# Patient Record
Sex: Female | Born: 1945
Health system: Southern US, Community
[De-identification: ages and names within clinical notes are randomized; demographics above are authoritative.]

## PROBLEM LIST (undated history)

## (undated) DIAGNOSIS — C50919 Malignant neoplasm of unspecified site of unspecified female breast: Secondary | ICD-10-CM

## (undated) DIAGNOSIS — I10 Essential (primary) hypertension: Secondary | ICD-10-CM

## (undated) DIAGNOSIS — Z87440 Personal history of urinary (tract) infections: Secondary | ICD-10-CM

## (undated) DIAGNOSIS — E1129 Type 2 diabetes mellitus with other diabetic kidney complication: Secondary | ICD-10-CM

## (undated) DIAGNOSIS — D219 Benign neoplasm of connective and other soft tissue, unspecified: Secondary | ICD-10-CM

## (undated) DIAGNOSIS — M8580 Other specified disorders of bone density and structure, unspecified site: Secondary | ICD-10-CM

## (undated) DIAGNOSIS — R809 Proteinuria, unspecified: Secondary | ICD-10-CM

## (undated) DIAGNOSIS — R011 Cardiac murmur, unspecified: Secondary | ICD-10-CM

## (undated) DIAGNOSIS — Z800 Family history of malignant neoplasm of digestive organs: Secondary | ICD-10-CM

## (undated) DIAGNOSIS — Z8041 Family history of malignant neoplasm of ovary: Secondary | ICD-10-CM

## (undated) DIAGNOSIS — D0339 Melanoma in situ of other parts of face: Secondary | ICD-10-CM

## (undated) DIAGNOSIS — Z803 Family history of malignant neoplasm of breast: Secondary | ICD-10-CM

## (undated) DIAGNOSIS — Z806 Family history of leukemia: Secondary | ICD-10-CM

## (undated) DIAGNOSIS — E785 Hyperlipidemia, unspecified: Secondary | ICD-10-CM

## (undated) HISTORY — PX: CATARACT EXTRACTION: SUR2

## (undated) HISTORY — DX: Personal history of urinary (tract) infections: Z87.440

## (undated) HISTORY — DX: Melanoma in situ of other parts of face: D03.39

## (undated) HISTORY — DX: Essential (primary) hypertension: I10

## (undated) HISTORY — PX: ECTOPIC PREGNANCY SURGERY: SHX613

## (undated) HISTORY — DX: Family history of leukemia: Z80.6

## (undated) HISTORY — DX: Proteinuria, unspecified: E11.29

## (undated) HISTORY — DX: Malignant neoplasm of unspecified site of unspecified female breast: C50.919

## (undated) HISTORY — DX: Hyperlipidemia, unspecified: E78.5

## (undated) HISTORY — DX: Cardiac murmur, unspecified: R01.1

## (undated) HISTORY — DX: Benign neoplasm of connective and other soft tissue, unspecified: D21.9

## (undated) HISTORY — DX: Family history of malignant neoplasm of ovary: Z80.41

## (undated) HISTORY — DX: Family history of malignant neoplasm of breast: Z80.3

## (undated) HISTORY — PX: ABDOMINAL HYSTERECTOMY: SHX81

## (undated) HISTORY — DX: Proteinuria, unspecified: R80.9

## (undated) HISTORY — DX: Other specified disorders of bone density and structure, unspecified site: M85.80

## (undated) HISTORY — DX: Family history of malignant neoplasm of digestive organs: Z80.0

## (undated) HISTORY — PX: CHOLECYSTECTOMY: SHX55

---

## 1998-05-22 ENCOUNTER — Observation Stay (HOSPITAL_COMMUNITY): Admission: RE | Admit: 1998-05-22 | Discharge: 1998-05-23 | Payer: Self-pay | Admitting: General Surgery

## 1998-05-30 ENCOUNTER — Ambulatory Visit (HOSPITAL_COMMUNITY): Admission: RE | Admit: 1998-05-30 | Discharge: 1998-05-30 | Payer: Self-pay | Admitting: General Surgery

## 1999-03-25 ENCOUNTER — Other Ambulatory Visit: Admission: RE | Admit: 1999-03-25 | Discharge: 1999-03-25 | Payer: Self-pay | Admitting: Obstetrics and Gynecology

## 2000-03-26 ENCOUNTER — Other Ambulatory Visit: Admission: RE | Admit: 2000-03-26 | Discharge: 2000-03-26 | Payer: Self-pay | Admitting: Obstetrics and Gynecology

## 2001-03-29 ENCOUNTER — Other Ambulatory Visit: Admission: RE | Admit: 2001-03-29 | Discharge: 2001-03-29 | Payer: Self-pay | Admitting: Obstetrics and Gynecology

## 2002-04-06 ENCOUNTER — Other Ambulatory Visit: Admission: RE | Admit: 2002-04-06 | Discharge: 2002-04-06 | Payer: Self-pay | Admitting: Obstetrics and Gynecology

## 2002-06-06 ENCOUNTER — Ambulatory Visit (HOSPITAL_COMMUNITY): Admission: RE | Admit: 2002-06-06 | Discharge: 2002-06-06 | Payer: Self-pay | Admitting: Gastroenterology

## 2003-04-21 ENCOUNTER — Other Ambulatory Visit: Admission: RE | Admit: 2003-04-21 | Discharge: 2003-04-21 | Payer: Self-pay | Admitting: Obstetrics and Gynecology

## 2003-06-01 ENCOUNTER — Encounter (INDEPENDENT_AMBULATORY_CARE_PROVIDER_SITE_OTHER): Payer: Self-pay | Admitting: *Deleted

## 2003-06-01 ENCOUNTER — Observation Stay (HOSPITAL_COMMUNITY): Admission: RE | Admit: 2003-06-01 | Discharge: 2003-06-02 | Payer: Self-pay | Admitting: Obstetrics and Gynecology

## 2007-02-08 ENCOUNTER — Encounter: Admission: RE | Admit: 2007-02-08 | Discharge: 2007-02-08 | Payer: Self-pay | Admitting: Internal Medicine

## 2007-03-26 ENCOUNTER — Other Ambulatory Visit: Admission: RE | Admit: 2007-03-26 | Discharge: 2007-03-26 | Payer: Self-pay | Admitting: Obstetrics and Gynecology

## 2008-03-09 ENCOUNTER — Inpatient Hospital Stay (HOSPITAL_COMMUNITY): Admission: AD | Admit: 2008-03-09 | Discharge: 2008-03-17 | Payer: Self-pay | Admitting: Internal Medicine

## 2008-10-11 ENCOUNTER — Other Ambulatory Visit: Admission: RE | Admit: 2008-10-11 | Discharge: 2008-10-11 | Payer: Self-pay | Admitting: Obstetrics and Gynecology

## 2010-12-18 ENCOUNTER — Encounter
Admission: RE | Admit: 2010-12-18 | Discharge: 2010-12-18 | Payer: Self-pay | Source: Home / Self Care | Attending: Radiology | Admitting: Radiology

## 2010-12-31 ENCOUNTER — Encounter
Admission: RE | Admit: 2010-12-31 | Discharge: 2010-12-31 | Payer: Self-pay | Source: Home / Self Care | Attending: Radiology | Admitting: Radiology

## 2010-12-31 ENCOUNTER — Other Ambulatory Visit: Payer: Self-pay | Admitting: Diagnostic Radiology

## 2011-01-09 ENCOUNTER — Ambulatory Visit: Admission: RE | Admit: 2011-01-09 | Payer: Self-pay | Source: Home / Self Care | Admitting: Surgery

## 2011-01-16 ENCOUNTER — Other Ambulatory Visit (HOSPITAL_COMMUNITY): Payer: Self-pay | Admitting: Surgery

## 2011-01-16 DIAGNOSIS — C50911 Malignant neoplasm of unspecified site of right female breast: Secondary | ICD-10-CM

## 2011-01-22 ENCOUNTER — Encounter: Payer: Self-pay | Admitting: Surgery

## 2011-01-22 DIAGNOSIS — C50919 Malignant neoplasm of unspecified site of unspecified female breast: Secondary | ICD-10-CM

## 2011-01-22 HISTORY — DX: Malignant neoplasm of unspecified site of unspecified female breast: C50.919

## 2011-01-28 ENCOUNTER — Encounter (HOSPITAL_COMMUNITY)
Admission: RE | Admit: 2011-01-28 | Discharge: 2011-01-28 | Disposition: A | Discharge status: Home / Self Care | Payer: Medicare Other | Source: Ambulatory Visit | Attending: Surgery | Admitting: Surgery

## 2011-01-28 ENCOUNTER — Ambulatory Visit (HOSPITAL_COMMUNITY)
Admission: RE | Admit: 2011-01-28 | Discharge: 2011-01-28 | Disposition: A | Discharge status: Home / Self Care | Payer: Medicare Other | Source: Ambulatory Visit | Attending: Surgery | Admitting: Surgery

## 2011-01-28 ENCOUNTER — Other Ambulatory Visit (HOSPITAL_COMMUNITY): Payer: Self-pay | Admitting: Surgery

## 2011-01-28 DIAGNOSIS — C50911 Malignant neoplasm of unspecified site of right female breast: Secondary | ICD-10-CM

## 2011-01-28 DIAGNOSIS — Z01818 Encounter for other preprocedural examination: Secondary | ICD-10-CM | POA: Insufficient documentation

## 2011-01-28 DIAGNOSIS — C50919 Malignant neoplasm of unspecified site of unspecified female breast: Secondary | ICD-10-CM | POA: Insufficient documentation

## 2011-01-28 HISTORY — PX: SIMPLE MASTECTOMY: SHX2409

## 2011-01-28 LAB — CBC
HCT: 45.9 % (ref 36.0–46.0)
MCHC: 34.4 g/dL (ref 30.0–36.0)
MCV: 88.6 fL (ref 78.0–100.0)
RDW: 12.3 % (ref 11.5–15.5)
WBC: 7.4 10*3/uL (ref 4.0–10.5)

## 2011-01-28 LAB — BASIC METABOLIC PANEL
BUN: 7 mg/dL (ref 6–23)
CO2: 29 mEq/L (ref 19–32)
GFR calc non Af Amer: >60 mL/min (ref >60)
Glucose, Bld: 170 mg/dL — ABNORMAL HIGH (ref 70–99)
Potassium: 5 mEq/L (ref 3.5–5.1)

## 2011-01-28 LAB — CANCER ANTIGEN 27.29: CA 27.29: 43 U/mL — ABNORMAL HIGH (ref 0–39)

## 2011-01-30 ENCOUNTER — Observation Stay (HOSPITAL_COMMUNITY)
Admission: RE | Admit: 2011-01-30 | Discharge: 2011-02-01 | Disposition: A | Discharge status: Home / Self Care | Payer: Medicare Other | Attending: Surgery | Admitting: Surgery

## 2011-01-30 ENCOUNTER — Ambulatory Visit (HOSPITAL_COMMUNITY)
Admission: RE | Admit: 2011-01-30 | Discharge: 2011-01-30 | Disposition: A | Discharge status: Home / Self Care | Payer: Medicare Other | Source: Ambulatory Visit | Attending: Surgery | Admitting: Surgery

## 2011-01-30 ENCOUNTER — Other Ambulatory Visit: Payer: Self-pay | Admitting: Surgery

## 2011-01-30 DIAGNOSIS — Z01810 Encounter for preprocedural cardiovascular examination: Secondary | ICD-10-CM | POA: Insufficient documentation

## 2011-01-30 DIAGNOSIS — C50911 Malignant neoplasm of unspecified site of right female breast: Secondary | ICD-10-CM

## 2011-01-30 DIAGNOSIS — C50919 Malignant neoplasm of unspecified site of unspecified female breast: Principal | ICD-10-CM | POA: Insufficient documentation

## 2011-01-30 MED ORDER — TECHNETIUM TC 99M SULFUR COLLOID FILTERED
1.0000 | Freq: Once | INTRAVENOUS | Status: AC | PRN
  Administered 2011-01-30: 1 via INTRADERMAL

## 2011-02-04 NOTE — Discharge Summary (Signed)
NAMESUNYA, Breanna Neal             ACCOUNT NO.:  [id number]  MEDICAL RECORD NO.:  [id number]           PATIENT TYPE:  I  LOCATION:  5156                         FACILITY:  [facility]  PHYSICIAN:  Sandria Bales. Ezzard Standing, M.D.  DATE OF BIRTH:  June 17, 1946  DATE OF ADMISSION:  01/30/2011 DATE OF DISCHARGE:  01/31/2011                              DISCHARGE SUMMARY  DISCHARGE DIAGNOSES: 1. Multifocal right breast carcinoma. 2. Hypercholesterolemia.  OPERATIONS PERFORMED:  On January 30, 2011, the patient had a right simple mastectomy and right axillary sentinel lymph node biopsy by Dr. Ovidio Kin and reconstruction of the right breast with a tissue expander and HD Flex by Dr. Etter Sjogren.  HISTORY OF PRESENT ILLNESS:  [name] is a 65 year old white female, patient of Rodrigo Ran, who had an abnormal mammogram which on biopsy showed a ductal carcinoma in situ in the upper quadrant of her right breast.  This biopsy was on December 10, 2010.  Unfortunately, when she underwent MRI, she was found to have a secondary area of concern and this was biopsied on December 31, 2010, and proved to be a grade 1 invasive ductal carcinoma.  So, she had 2 areas of breast cancer which were widely separated consistent with multifocal disease.  We talked about the possibility of lumpectomy and felt that she would be best served with proceeding with mastectomy to control her breast cancer.  She saw Dr. Etter Sjogren for consideration of breast reconstruction at the same time with mastectomy.  REVIEW OF SYSTEMS:  Significant that she has been treated for hypercholesterolemia, has had regular colonoscopies every 5 years by Dr. Dorena Cookey because her mother had colon cancer, has had some history of kidney infections followed by Dr. Larey Dresser, but otherwise has actually been healthy.  On the day of admission, she was taken to the operating room.  She underwent an injection of her right breast with  technetium, sulfur colloid, and methylene blue and then underwent identification of her right axillary sentinel lymph node with a right simple mastectomy by Dr. Ovidio Kin and then Dr. Etter Sjogren did immediate reconstruction of her right breast with a tissue expander HD Flex mesh.  The patient is now 1 day postop.  She has done well.  Her temperature is 98.3, her pulse is 69.  Her JP drains have only drained about 50 mL out of 2 drains and she appears ready for discharge.  My portion of her discharge instructions will include:   Activity per Dr. Odis Luster.   She have a regular diet.   Wound care per Dr. Odis Luster.   She is to see me back in 2 weeks for followup wound check and review of pathology. She is to check with Dr. Odis Luster early next week to follow up on her drains and breast wound.   I have given her Vicodin 5/325, #30 tablets with 1 refill for pain, and then she will resume home medications on discharge which include: 1. Lipitor 40 mg daily. 2. She takes vitamins.   Sandria Bales. Ezzard Standing, M.D., FACS   DHN/MEDQ  D:  01/31/2011  T:  01/31/2011  Job:  [id number]  cc:   [name], M.D. Etter Sjogren, M.D. Dr. Jeralyn Ruths Edwena Felty. Romine, M.D.  Electronically Signed by Ovidio Kin M.D. on 02/04/2011 09:12:59 AM

## 2011-02-04 NOTE — Op Note (Signed)
Breanna Neal, Breanna Neal             ACCOUNT NO.:  [id number]  MEDICAL RECORD NO.:  [id number]           PATIENT TYPE:  I  LOCATION:  5156                         FACILITY:  [facility]  PHYSICIAN:  Breanna Neal, M.D.  DATE OF BIRTH:  1946/08/17  DATE OF PROCEDURE:  01/28/2011                              OPERATIVE REPORT   PREOPERATIVE DIAGNOSIS:  Right breast cancer, multifocal.  POSTOPERATIVE DIAGNOSIS:  Right breast cancer, multifocal.  PROCEDURE: 1. Right breast simple mastectomy. 2. Injection of methylene blue. 3. Right axillary sentinel lymph node biopsy by Breanna Neal. 4. Breast reconstruction by Breanna Neal.  ANESTHESIA:  General endotracheal.  ESTIMATED BLOOD LOSS:  150 mL.  DRAINS:  Left in were none.  INDICATION FOR PROCEDURE:  Breanna Neal is a 65 year old white female, patient of Breanna Neal, who had an abnormal mammogram, which on biopsy showed ductal carcinoma in situ in the upper quadrant of herright breast.  Unfortunately, she had a second area seen on MRI, which was biopsied on December 31, 2010, this was proved to be a grade 1 invasive ductal carcinoma.  This was more central in the breast.  These two areas are so widely separated, we talked about a wide lumpectomy versus mastectomy, but felt in the patient's best interest, should be best served with a mastectomy.  She has seen Breanna Neal for discussion of breast reconstruction.  The indications and potential complications of surgery had been explained to the patient.  Potential complications include, but are not limited to, bleeding, infection, nerve injury, and recurrence of the cancer.  OPERATIVE NOTE:  The patient was placed in the supine position.  She had injected in the holding area of 1 millicurie of Technetium sulfur colloid for identification of the sentinel lymph node biopsy.  She was taken to the operating room in room #4 where she underwent a general anesthesia, supervised  by Breanna Neal.  Both breasts and right axilla were prepped with ChloraPrep and sterilely draped.  A time-out was held and surgical checklist brought out.  I also injected her right breast with 40% methylene blue using approximately 1 mL.  I first dissected the axillary lymph node.  I cut down two lymph nodes which was hot and I identified a lymph node that had counts of 1200 with a background of 5, and this was sent for permanent right axillary sentinel lymph node biopsy.  Hemostasis with Bovie electrocautery and 3- 0 Vicryl pop-off.    I then started the mastectomy.  I tried to do a spare skin as best as possible.  I did excise the nipple.  I went medially to the lateral edge of the sternum, inferiorly to the investing fascia of the rectus abdominis muscle, superiorly to about two fingerbreadths below the clavicle, and laterally to latissimus dorsi muscle.  I then reflected the breast off the pectoralis major muscle and dissected to where I done the sentinel lymph node biopsy, taking the breast tissue all the way to the biopsy cavity in the right axilla.  I then put a long suture to mark the lateral margin of the breast.  I then irrigated the chest with saline about 2 liters.  I controlled the bleeding with Bovie electrocautery primarily.  I did use some 3-0 Vicryl pop-offs for a couple of bleeders. At this point, Breanna Neal scrubbed in for the planned breast reconstruction.  He will dictate this part of the operation.  The patient tolerated the procedure well.  She remains in the operating room with Breanna Neal completing the operation.   Breanna Neal, M.D., FACS   DHN/MEDQ  D:  01/30/2011  T:  01/31/2011  Job:  [id number]  cc:   Breanna Neal, M.D. Breanna Neal, M.D. Breanna Neal  Electronically Signed by Breanna Neal M.D. on 02/04/2011 09:10:20 AM

## 2011-02-26 ENCOUNTER — Encounter (HOSPITAL_BASED_OUTPATIENT_CLINIC_OR_DEPARTMENT_OTHER): Payer: Medicare Other | Admitting: Oncology

## 2011-02-26 DIAGNOSIS — D0590 Unspecified type of carcinoma in situ of unspecified breast: Secondary | ICD-10-CM

## 2011-03-13 NOTE — Discharge Summary (Signed)
  NAMEJERMEKA, Breanna Neal             ACCOUNT NO.:  [id number]  MEDICAL RECORD NO.:  [id number]           PATIENT TYPE:  I  LOCATION:  5156                         FACILITY:  [facility]  PHYSICIAN:  Etter Sjogren, M.D.     DATE OF BIRTH:  08/24/1946  DATE OF ADMISSION:  01/30/2011 DATE OF DISCHARGE:  02/01/2011                              DISCHARGE SUMMARY   FINAL DIAGNOSIS:  Right breast cancer.  PROCEDURES: 1. Right mastectomy. 2. Right sentinel lymph node. 3. Right breast reconstruction with tissue expander and acellular     dermal matrix.  HISTORY AND PHYSICAL:  A 65 year old woman who has right breast cancer. Mastectomy was planned and she voiced understanding reconstruction. Options discussed.  She selected tissue expander as a planned stage procedure with eventual placement of an implant.  She understood that we might need to use the acellular dermal matrix with this reconstruction. For further details of history and physical, please see the chart.  COURSE IN THE HOSPITAL:  On admission, she was taken to surgery, at which time the procedures were performed.  She tolerated those very well.  Postoperatively, she did well.  Skin flaps, superior mastectomy flap, little bit dusky, and the inferior mastectomy flaps looked very good.  By the second postoperative day that area had demarcated and it was just a very narrow band just a few millimeters wide at medial aspect of the superior flap indicative of epidermolysis.  The remainder of it has good capillary refill, 1-2 seconds, and looks much better today. Drains were functioning.  Drainage is thin.  She did have a little bit of nausea early this morning of discharge when she took pain medicine on an empty stomach that has resolved and she feels that she did not have any nausea yesterday on the same pain medicine and she would like to go ahead with her discharge today.  DISPOSITION:  Empty the drains three times a day, record the  amount. Incentive spirometry every day.  No lifting.  No vigorous activity.  No exercising.  No shower yet.  Follow up with me in the office next week.  DISCHARGE MEDICATIONS: 1. Dilaudid 2-4 mg p.o. q.4 h. p.r.n. for pain. 2. Phenergan 25 mg one p.o. q.4 p.r.n. nausea. 3. Robaxin 500 mg one p.o. q.8. 4. Keflex 500 mg one p.o. b.i.d. and then for 3 days and then one p.o.     daily. 5. Colace 100 mg p.o. b.i.d. 6. Stool softener.     Etter Sjogren, M.D.     DB/MEDQ  D:  02/01/2011  T:  02/01/2011  Job:  [id number]  Electronically Signed by Etter Sjogren M.D. on 03/13/2011 05:05:44 PM

## 2011-03-13 NOTE — Op Note (Signed)
Breanna Neal, Breanna Neal             ACCOUNT NO.:  [id number]  MEDICAL RECORD NO.:  [id number]           PATIENT TYPE:  I  LOCATION:  5156                         FACILITY:  [facility]  PHYSICIAN:  Etter Sjogren, M.D.     DATE OF BIRTH:  07-Mar-1946  DATE OF PROCEDURE:  01/30/2011 DATE OF DISCHARGE:                              OPERATIVE REPORT   PREOPERATIVE DIAGNOSIS:  Right breast cancer.  POSTOPERATIVE DIAGNOSIS:  Right breast cancer.  PROCEDURE:  Reconstruction right breast tissue with expander and HD Flex (acellular dermal matrix).  SURGEON:  Etter Sjogren, MD  ASSISTANT:  None.  ANESTHESIA:  General.  ESTIMATED BLOOD LOSS:  40 mL.  DRAINS:  Two 19-French.  CLINICAL NOTE:  This 65 year old woman has right breast cancer. Mastectomy is planned and she desires reconstruction, options discussed. She selected a staged procedure, placement of tissue expander and possible using HD Flex today and then eventually as a staged procedure removing the expander and placing implant.  Ashby Dawes of procedure, risks, plus complications discussed her in great detail and she understood these risks and wished to proceed.  PROCEDURE:  Dr. Ezzard Standing, General Surgery, had completed the mastectomy. The dissection was carried deep to the pectoralis major muscle and space was created there.  There was an area of significant weakness medially. Thorough irrigation with saline and the tissue expander was prepared after thoroughly cleaning gloves.  This was a Dance movement psychotherapist low-contour 650 mL expander in antibiotic solution.  All of the air was removed, 150 mL sterile saline placed using a closed filling system.  Antibiotic solution placed in the wound as well.  Excellent hemostasis was confirmed and the expander in position.  The HD Flex was then used ultra thick with the care taken to place the dermal side up towards the mastectomy flap skin and it was sutured om placed using a PDS 3-0 running simple suture to the  pectoralis muscle both medial and lateral and then down onto the chest wall laterally.  Great care was taken to avoid damage to the underlying tissue expander which was kept under direct vision at all times.  After conclusion of this closure, the tissue expander was well covered and again this was a 550 mL low-contour [place] tissue expander.  Two drains were positioned 19-French, brought through separate stab wounds inferolaterally and secured with 3-0 Prolene suture.  Irrigation with saline, irrigation with antibiotic solution, and excellent hemostasis was confirmed.  The superior mastectomy flap appeared to be dusky.  The superior skin was trimmed. The inferior flaps looked very healthy.  Bright red bleeding was then noted on the superior flap which was sent back to that point and the skin appeared much healthier.  It was closed with 3-0 Monocryl interrupted inverted deep dermal sutures and the sentinel lymph node biopsy site also 3-0 Monocryl interrupted inverted deep dermal and 4-0 Monocryl running subcuticular suture.  Dermabond was then placed and dry sterile dressings, antibiotic ointment, and dry sterile dressing around the drain sites and the breast vest was positioned, and she was transported to the recovery room stable and tolerated procedure well.     Etter Sjogren, M.D.  DB/MEDQ  D:  01/30/2011  T:  01/31/2011  Job:  [id number]  Electronically Signed by Etter Sjogren M.D. on 03/13/2011 05:05:35 PM

## 2011-05-06 NOTE — H&P (Signed)
Breanna Neal, Breanna Neal             ACCOUNT NO.:  [id number]   MEDICAL RECORD NO.:  [id number]           PATIENT TYPE:   LOCATION:                                 FACILITY:   PHYSICIAN:  Mark A. Perini, M.D.   DATE OF BIRTH:  June 12, 1946   DATE OF ADMISSION:  03/09/2008  DATE OF DISCHARGE:                              HISTORY & PHYSICAL   CHIEF COMPLAINT:  Nausea, vomiting.   HISTORY OF PRESENT ILLNESS:  Breanna Neal is a pleasant 65 year old female  with a benign past history who has been sick for the last 4 or 5 days.  This started 4 days prior to admission with chills and fever to 102  degrees.  She had significant nausea, vomiting and diarrhea which has  persisted through today.  She is passing some urine but is keeping no  foods down and very little bit of fluids down.  She also has developed  some cough and stuffy nose symptoms as well.  She presented to the  office and is tachycardiac with heart rate of 122 and had a temperature  100 and appears dehydrated and will be admitted for further care.   PAST HISTORY:  1. Cholecystectomy, 1999  2. G2, P1, SAB 1 parity status.  She did have an ectopic pregnancy in      the distant past.  Postmenopausal since her late 59s.  History of      fibroid tumors.  3. Seasonal allergic rhinitis.  4. Hyperlipidemia.  5. Vaginal hysterectomy and bilateral oophorectomy.  6. Osteopenia.  7. Overactive bladder.  8. Impaired fasting glucose.  9. Possible white coat syndrome.   ALLERGIES:  NO KNOWN ALLERGIES.   MEDICATIONS:  Calcium with D twice a day, Imodium as needed, aspirin 81  mg twice a week, Allegra as needed, Vytorin 10/40 daily, VESIcare 10 mg  daily, and fish oil 1200 mg daily.   SOCIAL HISTORY:  Her husband, Rosanne Ashing, is with her.  She had been married  since 1964.  She has 1 son and 3 grandchildren.  She has a 12th grade  education.  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  She has no tobacco use.  Rare alcohol  use.  No drug use.   FAMILY HISTORY:  Father died at 87 of pancreatic cancer.  Mother died at  age 27 of colon cancer and lung cancer.  She is 2 sisters living who are  and 1 has hyperlipidemia.   REVIEW OF SYSTEMS:  As per the history present illness.  Again, she has  had shaking chills.  She also reports myalgias.  She has felt somewhat  short of breath as well.   PHYSICAL EXAM:  She appears weak and her skin is dry.  Her or mucous  membranes are dry.  She does respond appropriately and is alert and oriented and  neurologically grossly intact.  There is no icterus or pallor.  Lungs are clear to auscultation bilaterally.  Heart is tachycardiac with no murmur or gallop.  Abdomen reveals normoactive bowel sounds.  There is very slight  tenderness in  the left upper quadrant but no mass.  No rebound, no  guarding.  There is no edema.   LABORATORY DATA:  Pending.   ASSESSMENT AND PLAN:  A 65 year old female with 4 days of nausea,  vomiting and diarrhea with inability to keep down oral food and liquids.  We will admit her and give her IV fluids with normal saline.  We will  check labs including electrolytes and CK enzyme.  We will hydrate her  aggressively and treat her supportively.  I think this is mostly a viral  gastroenteritis at this point.  We will have to see if her lab work  takes [place] in any different direction.      Mark A. Perini, M.D.  Electronically Signed     MAP/MEDQ  D:  03/09/2008  T:  03/09/2008  Job:  [id number]

## 2011-05-06 NOTE — Discharge Summary (Signed)
[name], [name]             ACCOUNT NO.:  [id number]   MEDICAL RECORD NO.:  [id number]          PATIENT TYPE:  INP   LOCATION:  5506                         FACILITY:  [facility]   PHYSICIAN:  Mark A. Perini, M.D.   DATE OF BIRTH:  April 11, 1946   DATE OF ADMISSION:  03/09/2008  DATE OF DISCHARGE:  03/17/2008                               DISCHARGE SUMMARY   DISCHARGE DIAGNOSES:  1. Escherichia coli bacteremia due to pyelonephritis on the left.  2. Gastroenteritis, which probably initiated this entire episode,      resolving at the time of discharge.  3. Volume depletion.  4. Hypokalemia.  5. Severe hypophosphatemia, improved at the time of discharge.  6. Hyperglycemia in the setting of impaired fasting glucose and acute      illness.  7. Hyperlipidemia.  8. Osteopenia.  9. History of overactive bladder.  10.Urosepsis.  11.Slight bright red bleeding per rectum, not thought to have a      significant gastrointestinal bleed this admission.   PROCEDURES:  1. Gastroenterology consultation, ultrasound of the kidneys on March 13, 2008, which showed no evidence for focal intraparenchymal fluid      collection within the left kidney.  2. CT scan of the abdomen and pelvis with IV contrast on March 11, 2008, showed left-sided pyelonephritis.  No evidence of renal or      ureteral calculi or hydronephrosis.  Negative CT of the pelvis.   DISCHARGE MEDICATIONS:  1. She may resume calcium with D twice a day with food.  She may      resume fish oil and vitamin D when she feels is able.  She should      wait a week or so and then resume her Vytorin 10/40 one each      evening.  She may wait to resume her VesiCare until Dr. Logan Bores      instructs; however, if she feels she needs to resume it earlier,      she may do so.  2. Allegra as needed is permissible.  3. Resume 81 mg aspirin twice a week.  4. We are going to treat her with Levaquin 500 mg daily for 7 further      days instead  of Augmentin.  5. Oral disintegrating Zofran as needed for nausea.  6. Tylenol 650 mg every 6 hours as needed.   HISTORY OF PRESENT ILLNESS:  Breanna Neal is a pleasant and otherwise  healthy 65 year old female who presented with 4-5 days of illness.  She  had fevers to 102 degrees and significant nausea, vomiting, and  diarrhea.  She was keeping no food down and very little fluid down.  She  was seen in the office and was found to be tachycardic with a heart rate  of 122 and a temperature of 100 and appeared volume depleted.  She was  admitted for further care.   HOSPITAL COURSE:  Kristan was admitted to a telemetry bed.  She remains  stable from a cardiovascular and pulmonary standpoint.  She was found to  have  positive blood and urine cultures for E. Coli, which was fairly  pansensitive.  She was treated empirically with IV Flagyl and Cipro, and  subsequently transitioned to intravenous Rocephin only.  She had  significant electrolyte disturbances and required replacement of  phosphorus and potassium, but tolerated this well.  She had significant  diarrhea but had negative C. diff toxin studies, and GI followed the  patient and her diarrhea symptoms gradually improved with the care of  her pyelonephritis which was seen on her CT scan of her abdomen and  correlated with her positive blood and urine cultures for E. coli.  Jeanett gradually improved.  Her elevated white count gradually  improved.  Followup renal ultrasound did not show any sign of an abscess  or other complicating feature of the kidney, and by March 17, 2008, she  was deemed stable for discharge home for continued care.   DISCHARGE PHYSICAL EXAM:  VITAL SIGNS:  Temperature 98.2 and afebrile,  pulse 70, respiratory rate 24, blood pressure 139/86, and blood sugars  range from 115 to 133.  GENERAL:  She was in no acute distress.  Alert and oriented x4.  LUNGS:  Clear to auscultation bilaterally with no wheezes, rales, or   rhonchi.  HEART:  Regular rate and rhythm with no murmur, rub, or gallop.  ABDOMEN:  Soft and nontender with no mass or hepatosplenomegaly.  EXTREMITIES:  There was no edema.  NEUROLOGIC:  Grossly intact.   DISCHARGE LABORATORY DATA:  White count of 11.3 with 67% segs, 25%  lymphocytes, 7% monocytes, hemoglobin 14.0, and platelet count 501,000.  Sodium 140, potassium 3.8, chloride 103, CO2 of 26, BUN 9, creatinine  1.03, glucose 108, alk phos 151, AST 29, ALT 30, total protein 6.6,  albumin slightly low at 2.9, calcium 9.4, phosphorus 3.9, and magnesium  2.4.  Other notable laboratory data upon discharge on March 10, 2008,  blood culture showed no growth in 5 days.  Fecal lactoferrin test on  March 12, 2008, was negative.  Stool culture on March 19, 2008 , was  negative.  C. diff toxin on March 12, 2008, was negative.  Blood culture  from March 12, 2008, was negative x2.  Her urine culture from March 10, 2008, grew E. coli greater than 100,000 colonies per mL, which again was  resistant to ampicillin but otherwise pansensitive and so did her blood  culture from March 10, 2008.  C. diff toxin on March 10, 2008, was  negative.  Hemoglobin A1c was 6.3.   DISCHARGE INSTRUCTIONS:  She is to follow a low-salt diet.  She is to  eat bland foods until her stomach tolerates more regular diet.  She is  to call up if she has any problem.  She will follow up in 2-3 weeks with  Dr. Waynard Edwards, and follow up with urology, Dr. Logan Bores, in 2-3 weeks as well.  She is to check her blood sugars twice a day before breakfast and  supper, and call us if they are persisting over 180.      Mark A. Perini, M.D.  Electronically Signed     MAP/MEDQ  D:  03/17/2008  T:  03/18/2008  Job:  [id number]   cc:   Jamison Neighbor, M.D.

## 2011-05-06 NOTE — Consult Note (Signed)
NAMEAMBERLI, RUEGG NO.:  [id number]   MEDICAL RECORD NO.:  [id number]          PATIENT TYPE:  INP   LOCATION:  5507                         FACILITY:  [facility]   PHYSICIAN:  Bernette Redbird, M.D.   DATE OF BIRTH:  08-Apr-1946   DATE OF CONSULTATION:  03/10/2008  DATE OF DISCHARGE:                                 CONSULTATION   REFERRING PHYSICIAN:  Mark A. Perini, M.D.   REASON FOR CONSULTATION:  We were asked to see Ms. Kost today in  consultation for nausea, vomiting and diarrhea x5 days by Dr. Rodrigo Ran.   HISTORY OF PRESENT ILLNESS:  This is a 65 year old female who was  admitted for dehydration by Dr. Waynard Edwards.  The patient reports that she  began to feel bad last Friday night approximately 1 week ago.  Her main  complaint then was nausea.  She started vomiting on Monday night.  Her  emesis consisted of food and clear liquid.  She denied any hematemesis  or coffee-grounds.  The patient normally has two loose bowel movements  per day, but for several days when she began to feel ill, she was  constipated.  Now for the last day and half or so, she has had watery  diarrhea.  She tells me that she had five watery bowel movements so far  today.  She denies any melena or hematochezia.  She denies any recent  antibiotic use or NSAID use.  She also denies abdominal pain.  Currently, the patient is feeling much better and she is able to keep  down liquids.   PAST MEDICAL HISTORY:  1. Osteopenia.  2. Hyperlipidemia.  3. Overactive bladder.  4. Possible white coat syndrome.  5. Allergic rhinitis.  6. Status post cholecystectomy.  7. Status post vaginal hysterectomy.  8. She is a patient of Dr. Dorena Cookey and he has performed two      colonoscopies on her, one in 2003, the other in 2008, both of which      were normal.  The most recent colonoscopy report is on the chart.   ALLERGIES:  No known drug allergies.   CURRENT MEDICATIONS:  Vytorin, Vesicare, aspirin  81 mg twice a week,  calcium, Imodium, Allegra and fish oil.   REVIEW OF SYSTEMS:  Significant for chills, myalgia, shortness of breath  and cough prior to vomiting over the last several days.  Again, these  have improved substantially today.  She has no sore throat.   FAMILY HISTORY:  Significant for colon cancer in her mother who died at  50 years of age and her father with pancreatic cancer who died in his  26s.   SOCIAL HISTORY:  Negative for tobacco or drugs.  She reports rare  alcohol.  She is an Pensions consultant for [employer].   PHYSICAL EXAMINATION:  GENERAL:  She is alert and oriented, very  pleasant to speak with.  She is in no apparent distress.  VITAL SIGNS:  Temperature is 99.1.  That is down from a high of 102  earlier this week, pulse is 92, respirations are 20,  blood pressure is  104/66.  HEART:  Regular rate and rhythm, slightly tachycardic.  LUNGS:  Clear to auscultation bilaterally.  ABDOMEN:  Soft, nontender, nondistended with good bowel sounds.   LABORATORY DATA AND X-RAY FINDINGS:  Significant for a phosphorus less  than 1, potassium 2.9, BUN 15, creatinine 0.96, glucose 200.  Hemoglobin  12.3, white count 11.6, hematocrit 36.0, platelets 166,000.  WBC in  urine was too numerous to count.  AST 36, ALT 52, total bilirubin 1.9,  indirect is 1.4, direct is 0.5, Alk phos is 126.   ASSESSMENT:  Dr. Molly Maduro Buccini has seen and examined the patient,  collected history and his impression is that this patient likely  experienced viral gastroenteritis and led to multiple electrolyte  imbalances.  She also apparently has a urinary tract infection.  She is  feeling better presently and it appears as though her gastroenteritis is  beginning to resolve.  Her hypokalemia is being repleted.  Her  hypophosphatemia is also being repleted.   RECOMMENDATIONS:  Recommend current management including empiric  antibiotics.  If the patient continues to improve,  would consider  advancing diet early in the morning.  We will follow with you.  No  urgent need for endoscopic procedures.   Thanks very much for this consultation.      Stephani Police, PA    ______________________________  Bernette Redbird, M.D.    MLY/MEDQ  D:  03/10/2008  T:  03/11/2008  Job:  [id number]   cc:   Everardo All. Madilyn Fireman, M.D.

## 2011-05-09 NOTE — Procedures (Signed)
[facility]  Patient:    Breanna Neal, Breanna Neal Visit Number: [id number] MRN: [id number]          Service Type: END Location: ENDO Attending Physician:  Louie Bun Dictated by:   Everardo All Madilyn Fireman, M.D. Proc. Date: 06/06/02 Admit Date:  06/06/2002   CC:         Cynthia P. Ashley Royalty, M.D.                           Procedure Report  PROCEDURE:  Colonoscopy.  INDICATION FOR PROCEDURE:  Family history of colon cancer in one first-degree relative and ovarian cancer in a second first-degree relative.  DESCRIPTION OF PROCEDURE:  The patient was placed in the left lateral decubitus position and placed on the pulse monitor with continuous low-flow oxygen delivered by nasal cannula.  She was sedated with 50 mg of IV Demerol and 5 mg of IV Versed.  The Olympus video colonoscope was inserted into the rectum and advanced to the cecum, confirmed by transillumination at McBurneys point and visualization of the ileocecal valve and appendiceal orifice.  The prep was excellent.  The cecum, ascending, transverse, descending, and sigmoid colon all appeared normal with no masses, polyps, diverticula or other mucosal abnormalities.  The rectum likewise appeared normal, and retroflex view of the anus revealed no obvious internal hemorrhoids.  The colonoscope was then withdrawn, and the patient returned to the recovery room in stable condition. She tolerated the procedure well, and there were no immediate complications.  IMPRESSION:  Basically normal colonoscopy.  PLAN:  Repeat study in five years. Dictated by:   Everardo All Madilyn Fireman, M.D. Attending Physician:  Louie Bun DD:  06/06/02 TD:  06/06/02 Job: [id number]

## 2011-05-09 NOTE — Op Note (Signed)
Breanna Neal, Breanna Neal                       ACCOUNT NO.:  [id number]   MEDICAL RECORD NO.:  [id number]                   PATIENT TYPE:  OBV   LOCATION:  9399                                 FACILITY:  [facility]   PHYSICIAN:  [name], M.D.             DATE OF BIRTH:  27-Dec-1945   DATE OF PROCEDURE:  06/01/2003  DATE OF DISCHARGE:                                 OPERATIVE REPORT   PREOPERATIVE DIAGNOSES:  1. Menorrhagia.  2. Known submucous myoma.   POSTOPERATIVE DIAGNOSES:  1. Menorrhagia.  2. Known submucous myoma.  3. Tests pending.   PROCEDURE:  Total vaginal hysterectomy, bilateral salpingo-oophorectomy.   SURGEON:  [name], M.D.   ASSISTANT:  Andres Ege, M.D.   ANESTHESIA:  General by LMA.   ESTIMATED BLOOD LOSS:  50 mL.   COMPLICATIONS:  None.   DESCRIPTION OF PROCEDURE:  The patient was taken to the operating room and  after the induction of adequate general anesthesia by LMA, was placed in the  dorsal lithotomy position and prepped and draped in the usual fashion.  The  bladder was drained with a red rubber catheter.  A posterior weighted and  anterior Sims retractor were placed and the cervix was grasped with Gerilyn Pilgrim  tenaculum.  The mucosa over the cervix was infiltrated with 1% Xylocaine  with 1:100 epinephrine, total of 8 mL.  A knife was used to incise the  mucosa.  It was pushed back off the cervix using sharp and blunt dissection.  There was an approximately 2 cm anterior submucosal cervical cyst that  required for the mucosa to be dissected further anteriorly than is typical  at this point in this surgery to get around the cyst.  This was done with  sharp dissection and the peritoneum could be visualized cephalad to the  cyst.  Attention was next turned to the posterior cul-de-sac.  The posterior  vaginal mucosa was grasped with pickups and a posterior colpotomy incision  was made.  The long banana retractor was placed. The  uterosacral ligaments  were clamped, cut and tied on each side using 0 chromic.  The hysterectomy  proceeded up the cardinal ligament clamping, cutting and tying in sequence.  Attention was next turned back anteriorly.  The peritoneum was elevated and  entered atraumatically.  A retractor was placed in the peritoneal space.  The uterine arteries on each side were clamped, cut and doubly tied using 0  chromic.  Attempt was then made to deliver the fundus posteriorly.  The  uterus was too large for that to be possible.  Therefore, a coring procedure  was done.  The cervix was cored from the fundus.  Several myomas were  removed. At that point the fundus could then be flipped posteriorly and the  pedicle containing the tube, the round and the utero-ovarian ligament on  each side could be clamped, cut and doubly tied.  Specimen was then sent to  pathology.  There was approximately a 2 cm clear ovarian cyst on the right.  The right tube was absent. The patient does give a history of having had a  previous ectopic which was removed through the vagina.  She was not aware  that a salpingectomy was done, however, no tube was present on that side.  On the left, tube and ovary appeared normal.  The infundibulopelvic ligament  was visualized, clamped, cut and doubly tied with 0 chromic on the left and  the specimen was sent to pathology.  This procedure was proceeded on the  right, grasping the ovary with the Babcock, identifying the  infundibulopelvic ligament, clamping it with a Heaney, cutting and doubly  tying it with 0 chromic.  On completion of tying the pedicle, there was  noted to be a small, approximately 0.5 cm, firm round mass in the  infundibulopelvic pedicle that appeared to be a small myoma.  This was  excised and sent to pathology as a separate specimen.  There was some  bleeding in the bed where the myoma had been that was controlled with a  figure-of-eight suture of 0 chromic.  The  posterior cuff was then run  between the uterosacral ligaments in a running, locking fashion using 0  chromic.  An antienterocele stitch was placed uniting the uterosacral  ligaments in the midline and tied.  The vagina was irrigated with warm  saline.  All pedicles were inspected and were hemostatic.  The vaginal cuff  was closed with interrupted figure-of-eight sutures of 0 chromic and the  procedure was terminated.  The bladder was drained with the Foley catheter  but it was not left to be indwelling, it was removed.  The instruments were  removed from the vagina and the procedure was terminated.  The patient was  taken to the recovery room in satisfactory condition.  Sponge, needle and  instrument counts were correct x3.                                               [name], M.D.    CPR/MEDQ  D:  06/01/2003  T:  06/02/2003  Job:  [id number]

## 2011-05-26 ENCOUNTER — Encounter (INDEPENDENT_AMBULATORY_CARE_PROVIDER_SITE_OTHER): Payer: Self-pay | Admitting: Surgery

## 2011-06-05 ENCOUNTER — Other Ambulatory Visit: Payer: Self-pay | Admitting: Oncology

## 2011-06-05 ENCOUNTER — Encounter (HOSPITAL_BASED_OUTPATIENT_CLINIC_OR_DEPARTMENT_OTHER): Payer: Medicare Other | Admitting: Oncology

## 2011-06-05 DIAGNOSIS — D0590 Unspecified type of carcinoma in situ of unspecified breast: Secondary | ICD-10-CM

## 2011-06-05 DIAGNOSIS — IMO0002 Reserved for concepts with insufficient information to code with codable children: Secondary | ICD-10-CM

## 2011-06-05 DIAGNOSIS — Z170 Estrogen receptor positive status [ER+]: Secondary | ICD-10-CM

## 2011-06-05 LAB — COMPREHENSIVE METABOLIC PANEL
Albumin: 4.9 g/dL (ref 3.5–5.2)
BUN: 17 mg/dL (ref 6–23)
CO2: 27 mEq/L (ref 19–32)
Calcium: 9.9 mg/dL (ref 8.4–10.5)
Chloride: 100 mEq/L (ref 96–112)
Glucose, Bld: 143 mg/dL — ABNORMAL HIGH (ref 70–99)
Potassium: 4.5 mEq/L (ref 3.5–5.3)
Sodium: 137 mEq/L (ref 135–145)
Total Protein: 7 g/dL (ref 6.0–8.3)

## 2011-06-05 LAB — CBC WITH DIFFERENTIAL/PLATELET
Basophils Absolute: 0.1 10*3/uL (ref 0.0–0.1)
Eosinophils Absolute: 0.1 10*3/uL (ref 0.0–0.5)
HGB: 15 g/dL (ref 11.6–15.9)
MCV: 85.3 fL (ref 79.5–101.0)
MONO#: 0.5 10*3/uL (ref 0.1–0.9)
NEUT#: 4.7 10*3/uL (ref 1.5–6.5)
RDW: 12.3 % (ref 11.2–14.5)
WBC: 7.8 10*3/uL (ref 3.9–10.3)
lymph#: 2.5 10*3/uL (ref 0.9–3.3)

## 2011-08-19 ENCOUNTER — Ambulatory Visit (HOSPITAL_BASED_OUTPATIENT_CLINIC_OR_DEPARTMENT_OTHER)
Admission: RE | Admit: 2011-08-19 | Discharge: 2011-08-19 | Disposition: A | Discharge status: Home / Self Care | Payer: Medicare Other | Source: Ambulatory Visit | Attending: Plastic Surgery | Admitting: Plastic Surgery

## 2011-08-19 ENCOUNTER — Ambulatory Visit (HOSPITAL_BASED_OUTPATIENT_CLINIC_OR_DEPARTMENT_OTHER): Admission: RE | Admit: 2011-08-19 | Payer: Medicare Other | Source: Ambulatory Visit | Admitting: Plastic Surgery

## 2011-08-19 DIAGNOSIS — Z87891 Personal history of nicotine dependence: Secondary | ICD-10-CM | POA: Insufficient documentation

## 2011-08-19 DIAGNOSIS — Z421 Encounter for breast reconstruction following mastectomy: Secondary | ICD-10-CM | POA: Insufficient documentation

## 2011-08-19 DIAGNOSIS — R7309 Other abnormal glucose: Secondary | ICD-10-CM | POA: Insufficient documentation

## 2011-08-19 DIAGNOSIS — Z01812 Encounter for preprocedural laboratory examination: Secondary | ICD-10-CM | POA: Insufficient documentation

## 2011-08-19 DIAGNOSIS — C50919 Malignant neoplasm of unspecified site of unspecified female breast: Secondary | ICD-10-CM | POA: Insufficient documentation

## 2011-08-24 NOTE — Op Note (Signed)
Breanna Neal, Breanna Neal NO.:  [id number]  MEDICAL RECORD NO.:  [id number]  LOCATION:                                 FACILITY:  PHYSICIAN:  Etter Sjogren, M.D.     DATE OF BIRTH:  [birth year]  DATE OF PROCEDURE:  08/19/2011 DATE OF DISCHARGE:                              OPERATIVE REPORT   PREOPERATIVE DIAGNOSIS:  Right breast cancer.  POSTOPERATIVE DIAGNOSIS:  Right breast cancer.  PROCEDURE:  Removal of expander in place of silicone gel implant right side.  SURGEON:  Etter Sjogren, MD  ANESTHESIA:  General.  ESTIMATED BLOOD LOSS:  5 mL.  DRAINS:  One 19-French was left.  CLINICAL NOTE:  A 65 year old woman who has had breast cancer, had mastectomy, had reconstruction with tissue expander, and now presents for the expander placement with implant.  The procedure risks and plus complications were explained to her, these included but were not limited to bleeding, infection, anesthesia complications, healing problems, scarring, loss of sensation, fluid accumulations, failure device, capsular contracture, wrinkles, ripples, displacement device, pneumothorax, pulmonary embolism and asymmetry disappointment and healing problems, and she understood all the risks and wished to proceed.  She selected silicone gel implant.  She desired a 800 mL implant.  DESCRIPTION OF PROCEDURE:  The patient was placed in a full upright position in the holding area, marked for the removal of expander placement with implant.  She was taken to the operating room, placed supine.  After successful general anesthesia, she was prepped with ChloraPrep, after waiting full 3 minutes for drying, she was draped with sterile drapes.  The old mastectomy scar was utilized as lateral aspect and incision made, dissection carried down through the subcutaneous tissue and the underlying muscle using electrocautery and the expander was then identified and deflated and removed.  The space was  inspected. It was in excellent condition with the fluid accumulations, no evidence of any infection.  Thorough irrigation with saline and antibiotic solution was also placed onto space.  The implant was covered with antibiotic solution as well in its container and was allowed to dwell in the antibiotic solution also for least 5 minutes.  A 19-French drain was positioned, brought through separate stab wound inferolaterally and secured with 3-0 Prolene suture.  After thoroughly cleaning gloves, the implant was then placed, excellent hemostasis was confirmed prior to placement, and proper orientation of the implant was confirmed. Antibiotic solution was placed in the space again just prior to implantation.  The 3-0 Vicryl interrupted figure-of-eight and simple sutures were used for the muscle and capsule closure with great care taken to avoid damage to underlying implant which was kept under direct vision at all times and then 3-0 Monocryl interrupted deep dermal and 4- 0 Monocryl interrupted deep dermal sutures and running 3-0 Monocryl subcuticular suture completed the closure.  Steri-Strips and dry sterile dressing and sterile dressing around the drain and a circumferential Ace wrap were placed and she was transferred to the recovery room stable having tolerated the procedure well.  DISPOSITION:  She will recheck in the office next week.  She will call Friday to report the drain amount and if the drain is down to just  a very little drainage then the drain will be removed prior to the weekend.     Etter Sjogren, M.D.     DB/MEDQ  D:  08/19/2011  T:  08/19/2011  Job:  [id number]  Electronically Signed by Etter Sjogren M.D. on 08/24/2011 09:05:32 AM

## 2011-09-15 LAB — CBC
HCT: 31.7 — ABNORMAL LOW
HCT: 34.2 — ABNORMAL LOW
HCT: 37.5
HCT: 39.2
HCT: 41.2
Hemoglobin: 11 — ABNORMAL LOW
Hemoglobin: 12.9
Hemoglobin: 13.3
Hemoglobin: 14
Hemoglobin: 15.3 — ABNORMAL HIGH
MCHC: 33.9
MCHC: 33.9
MCHC: 34
MCHC: 34.1
MCHC: 34.2
MCHC: 34.3
MCV: 87.7
MCV: 88.4
MCV: 89.1
MCV: 89.1
Platelets: 166
Platelets: 290
Platelets: 501 — ABNORMAL HIGH
RBC: 3.62 — ABNORMAL LOW
RBC: 3.9
RBC: 4.02
RBC: 4.11
RBC: 4.4
RBC: 5.08
RDW: 12.9
RDW: 13.2
RDW: 13.2
WBC: 11.1 — ABNORMAL HIGH
WBC: 11.3 — ABNORMAL HIGH
WBC: 11.6 — ABNORMAL HIGH
WBC: 12.6 — ABNORMAL HIGH
WBC: 7.6

## 2011-09-15 LAB — COMPREHENSIVE METABOLIC PANEL
ALT: 28
ALT: 30
ALT: 35
AST: 22
AST: 25
AST: 36
Albumin: 2 — ABNORMAL LOW
Albumin: 2.4 — ABNORMAL LOW
Albumin: 2.9 — ABNORMAL LOW
Alkaline Phosphatase: 149 — ABNORMAL HIGH
Alkaline Phosphatase: 151 — ABNORMAL HIGH
Alkaline Phosphatase: 153 — ABNORMAL HIGH
BUN: 3 — ABNORMAL LOW
BUN: 4 — ABNORMAL LOW
BUN: 7
BUN: 7
BUN: 9
CO2: 20
CO2: 20
CO2: 23
CO2: 25
Calcium: 7.5 — ABNORMAL LOW
Calcium: 8.8
Calcium: 8.9
Chloride: 103
Chloride: 103
Chloride: 104
Chloride: 108
Creatinine, Ser: 0.74
Creatinine, Ser: 0.78
Creatinine, Ser: 0.88
GFR calc Af Amer: >60
GFR calc Af Amer: >60
GFR calc Af Amer: >60
GFR calc non Af Amer: >60
GFR calc non Af Amer: >60
GFR calc non Af Amer: >60
GFR calc non Af Amer: >60
GFR calc non Af Amer: >60
Glucose, Bld: 113 — ABNORMAL HIGH
Glucose, Bld: 114 — ABNORMAL HIGH
Glucose, Bld: 117 — ABNORMAL HIGH
Glucose, Bld: 128 — ABNORMAL HIGH
Potassium: 3.3 — ABNORMAL LOW
Potassium: 3.8
Sodium: 136
Sodium: 140
Sodium: 143
Total Bilirubin: 0.8
Total Bilirubin: 0.9
Total Bilirubin: 1
Total Bilirubin: 1.1
Total Protein: 5.4 — ABNORMAL LOW
Total Protein: 6.3
Total Protein: 6.6

## 2011-09-15 LAB — CULTURE, BLOOD (ROUTINE X 2)
Culture: NO GROWTH
Culture: NO GROWTH
Culture: NO GROWTH

## 2011-09-15 LAB — DIFFERENTIAL
Basophils Absolute: 0
Basophils Absolute: 0
Basophils Absolute: 0
Basophils Absolute: 0
Basophils Absolute: 0.1
Basophils Relative: 0
Basophils Relative: 0
Basophils Relative: 0
Basophils Relative: 0
Basophils Relative: 1
Eosinophils Absolute: 0
Eosinophils Absolute: 0
Eosinophils Absolute: 0
Eosinophils Absolute: 0.1
Eosinophils Absolute: 0.1
Eosinophils Absolute: 0.2
Eosinophils Relative: 0
Eosinophils Relative: 1
Eosinophils Relative: 1
Eosinophils Relative: 2
Eosinophils Relative: 2
Lymphocytes Relative: 12
Lymphocytes Relative: 15
Lymphocytes Relative: 19
Lymphocytes Relative: 5 — ABNORMAL LOW
Lymphs Abs: 1.6
Lymphs Abs: 2.1
Lymphs Abs: 2.3
Monocytes Absolute: 0.7
Monocytes Absolute: 0.7
Monocytes Absolute: 0.8
Monocytes Relative: 6
Monocytes Relative: 6
Monocytes Relative: 7
Monocytes Relative: 7
Monocytes Relative: 8
Neutro Abs: 12.7 — ABNORMAL HIGH
Neutro Abs: 7.5
Neutro Abs: 7.9 — ABNORMAL HIGH
Neutro Abs: 8.2 — ABNORMAL HIGH
Neutro Abs: 9 — ABNORMAL HIGH
Neutro Abs: 9.9 — ABNORMAL HIGH
Neutrophils Relative %: 71
Neutrophils Relative %: 71
Neutrophils Relative %: 76
Neutrophils Relative %: 86 — ABNORMAL HIGH

## 2011-09-15 LAB — HEMOGLOBIN AND HEMATOCRIT, BLOOD
HCT: 35.8 — ABNORMAL LOW
Hemoglobin: 12.2

## 2011-09-15 LAB — CARDIAC PANEL(CRET KIN+CKTOT+MB+TROPI)
Relative Index: INVALID
Troponin I: 0.03

## 2011-09-15 LAB — STOOL CULTURE

## 2011-09-15 LAB — PHOSPHORUS
Phosphorus: 1.9 — ABNORMAL LOW
Phosphorus: 2 — ABNORMAL LOW
Phosphorus: 3.9
Phosphorus: <1 — CL

## 2011-09-15 LAB — HEPATIC FUNCTION PANEL
AST: 36
Albumin: 3.3 — ABNORMAL LOW
Alkaline Phosphatase: 126 — ABNORMAL HIGH
Total Bilirubin: 1.9 — ABNORMAL HIGH

## 2011-09-15 LAB — URINALYSIS, ROUTINE W REFLEX MICROSCOPIC
Glucose, UA: NEGATIVE
Ketones, ur: 15 — AB
Protein, ur: 100 — AB

## 2011-09-15 LAB — BASIC METABOLIC PANEL
BUN: 15
CO2: 21
CO2: 24
Calcium: 7.5 — ABNORMAL LOW
Calcium: 8.9
Chloride: 99
Creatinine, Ser: 0.96
Creatinine, Ser: 1.14
GFR calc Af Amer: >60
Glucose, Bld: 172 — ABNORMAL HIGH

## 2011-09-15 LAB — HEMOGLOBIN A1C: Hgb A1c MFr Bld: 6.3 — ABNORMAL HIGH

## 2011-09-15 LAB — MAGNESIUM
Magnesium: 1.8
Magnesium: 2
Magnesium: 2.1
Magnesium: 2.1
Magnesium: 2.2
Magnesium: 2.3

## 2011-09-15 LAB — FECAL LACTOFERRIN, QUANT: Fecal Lactoferrin: NEGATIVE

## 2011-09-15 LAB — URINE CULTURE
Colony Count: >=100000
Special Requests: NEGATIVE

## 2011-09-15 LAB — GIARDIA/CRYPTOSPORIDIUM SCREEN(EIA): Giardia Screen - EIA: NEGATIVE

## 2011-09-15 LAB — URINE MICROSCOPIC-ADD ON

## 2011-09-15 LAB — CLOSTRIDIUM DIFFICILE EIA: C difficile Toxins A+B, EIA: NEGATIVE

## 2011-10-07 ENCOUNTER — Encounter (INDEPENDENT_AMBULATORY_CARE_PROVIDER_SITE_OTHER): Payer: Self-pay

## 2011-10-10 ENCOUNTER — Ambulatory Visit (INDEPENDENT_AMBULATORY_CARE_PROVIDER_SITE_OTHER): Payer: Medicare Other | Admitting: Surgery

## 2011-10-10 ENCOUNTER — Encounter (INDEPENDENT_AMBULATORY_CARE_PROVIDER_SITE_OTHER): Payer: Self-pay | Admitting: Surgery

## 2011-10-10 VITALS — BP 128/86 | HR 64 | Temp 98.1°F | Resp 20 | Ht 64.0 in | Wt 152.2 lb

## 2011-10-10 DIAGNOSIS — C50311 Malignant neoplasm of lower-inner quadrant of right female breast: Secondary | ICD-10-CM | POA: Insufficient documentation

## 2011-10-10 DIAGNOSIS — Z853 Personal history of malignant neoplasm of breast: Secondary | ICD-10-CM

## 2011-10-10 NOTE — Progress Notes (Signed)
ASSESSMENT AND PLAN: 1.  Right breast cancer, T1a, N0   (there was an area of invasive ductal ca on her original core biopsy, but the mastectomy had only DCIS)  Right mastectomy - 01/30/2011  On Aromasin.  Tolerating well.  No evidence of disease.    Return to see me in 6 months.  2.  Right breast reconstruction with implant by Dr. Odis Luster.   Completed implant 08/19/2011.  3.  Hypercholesterolemia.  HISTORY OF PRESENT ILLNESS: Chief Complaint  Patient presents with  . Other    f/u right masty     Breanna Neal is a 65 y.o. (DOB: 08/26/1946)  white female who is a patient of PERINI,MARK A, MD, MD and comes to me today for follow up of right breast cancer.  The patient has done well since I last saw her. She had her expander replaced with a silicone gel implant Dr. Odis Luster on 19 August 2011. After her first surgery she had trouble with a frozen right shoulder. This improved with physical therapy. She's had a little bit of the same frozen shoulder with the most recent surgery and is back doing her exercises.  She is noticed no new mass or lump. Dr. Drue Second has placed her on Aromasin which she is tolerating well. [name] is glad to have the surgery behind her.  Path (side, TNM): Right, T1a, N0 Surgery: Right mastectomy, SLNBx   Date: 01/30/2011 Size of tumor: small cm Nodes: 0/3 ER: 98% PR: 96% Ki67: 11%  HER2Neu: Neg  Medical Oncologist: Welton Flakes  Radiation Oncologist: None  PHYSICAL EXAM: BP 128/86  Pulse 64  Temp 98.1 F (36.7 C)  Resp 20  Ht 5\' 4"  (1.626 m)  Wt 152 lb 4 oz (69.06 kg)  BMI 26.13 kg/m2  HEENT:  Pupils equal.  Dentition good.  No injury. NECK:  Supple.  No thyroid mass. LYMPH NODES:  No cervical, supraclavicular, or axillary adenopathy. BREASTS -  RIGHT:  Reconstructed breast with implant. Nipple reconstruction pending.   LEFT:  No palpable mass or nodule.  No nipple discharge. UPPER EXTREMITIES:  No evidence of lymphedema.  She did have frozen right  shoulder after the first surgery, but has done well with PT.  DATA REVIEWED: None

## 2011-12-10 ENCOUNTER — Ambulatory Visit (HOSPITAL_BASED_OUTPATIENT_CLINIC_OR_DEPARTMENT_OTHER): Payer: Medicare Other | Admitting: Oncology

## 2011-12-10 ENCOUNTER — Telehealth: Payer: Self-pay | Admitting: Oncology

## 2011-12-10 ENCOUNTER — Other Ambulatory Visit (HOSPITAL_BASED_OUTPATIENT_CLINIC_OR_DEPARTMENT_OTHER): Payer: Medicare Other | Admitting: Lab

## 2011-12-10 ENCOUNTER — Other Ambulatory Visit: Payer: Self-pay | Admitting: Oncology

## 2011-12-10 VITALS — BP 139/90 | HR 103 | Temp 98.5°F | Ht 64.0 in | Wt 213.7 lb

## 2011-12-10 DIAGNOSIS — D0590 Unspecified type of carcinoma in situ of unspecified breast: Secondary | ICD-10-CM

## 2011-12-10 DIAGNOSIS — N9489 Other specified conditions associated with female genital organs and menstrual cycle: Secondary | ICD-10-CM

## 2011-12-10 DIAGNOSIS — C50919 Malignant neoplasm of unspecified site of unspecified female breast: Secondary | ICD-10-CM

## 2011-12-10 DIAGNOSIS — Z853 Personal history of malignant neoplasm of breast: Secondary | ICD-10-CM

## 2011-12-10 LAB — CBC WITH DIFFERENTIAL/PLATELET
BASO%: 0.7 % (ref 0.0–2.0)
HCT: 45.5 % (ref 34.8–46.6)
LYMPH%: 32.9 % (ref 14.0–49.7)
MCH: 30.3 pg (ref 25.1–34.0)
MCHC: 33.7 g/dL (ref 31.5–36.0)
MCV: 90.1 fL (ref 79.5–101.0)
MONO#: 0.5 10*3/uL (ref 0.1–0.9)
MONO%: 7.5 % (ref 0.0–14.0)
NEUT%: 57.6 % (ref 38.4–76.8)
Platelets: 252 10*3/uL (ref 145–400)
RBC: 5.06 10*6/uL (ref 3.70–5.45)
WBC: 6.4 10*3/uL (ref 3.9–10.3)

## 2011-12-10 LAB — COMPREHENSIVE METABOLIC PANEL WITH GFR
ALT: 28 U/L (ref 0–35)
AST: 24 U/L (ref 0–37)
Albumin: 4.6 g/dL (ref 3.5–5.2)
Alkaline Phosphatase: 70 U/L (ref 39–117)
BUN: 10 mg/dL (ref 6–23)
CO2: 26 meq/L (ref 19–32)
Calcium: 10.4 mg/dL (ref 8.4–10.5)
Chloride: 105 meq/L (ref 96–112)
Creatinine, Ser: 0.73 mg/dL (ref 0.50–1.10)
Glucose, Bld: 111 mg/dL — ABNORMAL HIGH (ref 70–99)
Potassium: 4.6 meq/L (ref 3.5–5.3)
Sodium: 142 meq/L (ref 135–145)
Total Bilirubin: 1.3 mg/dL — ABNORMAL HIGH (ref 0.3–1.2)
Total Protein: 7.1 g/dL (ref 6.0–8.3)

## 2011-12-10 MED ORDER — EXEMESTANE 25 MG PO TABS: 25.0000 mg | ORAL_TABLET | Freq: Every day | ORAL | Status: DC

## 2011-12-10 NOTE — Telephone Encounter (Signed)
Gv pt appt for [id number] 

## 2011-12-10 NOTE — Progress Notes (Signed)
CC: Sandria Bales. Ezzard Standing, M.D.  [name], M.D.  [name], M.D.   DIAGNOSIS:  65 year old female with: 1. Stage I invasive ductal carcinoma with ductal carcinoma in situ of the right breast status post mastectomy on January 30, 2011.  The tumor was ER positive 98%, PR positive 96%, Ki-67 11% and HER2/neu negative. 2. History of kidney infections. 3. Diabetes which is exercise and diet controlled.  CURRENT THERAPY:  The patient is on Aromasin 25 mg daily since 02/26/2011.  PAST THERAPY:   1. The patient underwent a mastectomy on 01/30/2011 with immediate reconstruction for a stage I invasive ductal carcinoma and DCIS. 2. She was then begun on Aromasin 25 mg daily on 02/26/2011. 3. The patient has had immediate reconstruction with implant  INTERVAL HISTORY: patient is seen in followup today overall she seems to be doing well. She is tolerating the Aromasin except for some mild aches and pains in her joints and some swelling of her hands but she tells me that it is very much tolerable. She does have ongoing vaginal dryness and she is not using the Estrace cream do to risk and concerns about breast cancer. And she says everything is basically tolerable. She is not experiencing many hot flashes. She denies any headaches double vision blurring of vision she has no fevers chills no night sweats. She denies any chest pains palpitations shortness of breath cough hemoptysis hematemesis she has no abnormal pain diarrhea or constipation. She has no breast tenderness. Remainder of the 10 point review of systems is negative.  ALLERGIES:  No known drug allergies.  CURRENT MEDICATIONS:  Current medications are reviewed and they are recorded separately in the electronic medical record  PERFORMANCE STATUS:  ECOG performance status is zero.  PHYSICAL EXAMINATION:  General:  The patient is awake, alert in no acute distress.  She appears well.   Filed Vitals:   12/10/11 1018  BP: 139/90  Pulse:  103  Temp: 98.5 F (36.9 C)    HEENT Exam:  EOMI.  PERRLA.  Sclerae anicteric.  No conjunctival pallor.  Oral mucosa is moist.  Neck:  Supple.  Lungs: Clear bilaterally to auscultation and percussion.  Cardiovascular:  Regular rate and rhythm.  No murmurs, gallops or rubs.  Abdomen:  Soft, nontender, nondistended.  Bowel sounds are present.  No HSM.  Extremities:  Trace edema.  Neuro:  Patient is alert, oriented, otherwise nonfocal.  Breasts:  Left breast no masses or nipple discharge.  Right reconstructed breast.  No evidence of any infections.  There is no right upper extremity edema.  LABORATORY DATA:  Lab Results  Component Value Date   WBC 6.4 12/10/2011   HGB 15.3 12/10/2011   HCT 45.5 12/10/2011   MCV 90.1 12/10/2011   PLT 252 12/10/2011    IMPRESSION AND PLAN:  65 year old female with: 1. Stage I invasive ductal carcinoma with ductal carcinoma in situ status post mastectomy in February 2012 for an ER/PR positive, HER2/neu negative favorable breast cancer.  She had immediate reconstruction.   2. The patient is now on Aromasin 25 mg daily which she is tolerating quite well, and we will continue this, and she was given a prescription for this. 3. Vaginal dryness and difficulty with intercourse.  Patient will continue to monitor her symptoms. I have also recommended that she be seen by Colman Cater our survivor clinic specialist for further discussion regarding vaginal dryness and difficulty with intercourse and see what strategies she can come up with for  the patient. 4. I will continue to see the patient every 6 months.  #5 patient and I had a long discussion regarding reconstructive procedures. She is planning on having some more reconstruction done on the right breast as well as nipple tattooing.  #6 I spent 30 minutes with the patient greater than 50% of the time was spent in counseling and coordination of care. Prescription for Aromasin was sent to her pharmacy.  #7 patient knows  to call me with any problems questions or concerns. She can certainly be seen sooner if any problems arise.    ______________________________ Drue Second, M.D. KK/MEDQ  D:  06/05/2011  T:  06/05/2011  Job:  [id number]

## 2011-12-25 DIAGNOSIS — M7500 Adhesive capsulitis of unspecified shoulder: Secondary | ICD-10-CM | POA: Diagnosis not present

## 2011-12-25 DIAGNOSIS — M25519 Pain in unspecified shoulder: Secondary | ICD-10-CM | POA: Diagnosis not present

## 2011-12-25 DIAGNOSIS — M25669 Stiffness of unspecified knee, not elsewhere classified: Secondary | ICD-10-CM | POA: Diagnosis not present

## 2011-12-31 DIAGNOSIS — M7500 Adhesive capsulitis of unspecified shoulder: Secondary | ICD-10-CM | POA: Diagnosis not present

## 2011-12-31 DIAGNOSIS — M25519 Pain in unspecified shoulder: Secondary | ICD-10-CM | POA: Diagnosis not present

## 2011-12-31 DIAGNOSIS — M25669 Stiffness of unspecified knee, not elsewhere classified: Secondary | ICD-10-CM | POA: Diagnosis not present

## 2012-01-02 ENCOUNTER — Encounter (INDEPENDENT_AMBULATORY_CARE_PROVIDER_SITE_OTHER): Payer: Self-pay | Admitting: Surgery

## 2012-02-23 DIAGNOSIS — M949 Disorder of cartilage, unspecified: Secondary | ICD-10-CM | POA: Diagnosis not present

## 2012-02-23 DIAGNOSIS — E785 Hyperlipidemia, unspecified: Secondary | ICD-10-CM | POA: Diagnosis not present

## 2012-02-23 DIAGNOSIS — E119 Type 2 diabetes mellitus without complications: Secondary | ICD-10-CM | POA: Diagnosis not present

## 2012-02-23 DIAGNOSIS — R82998 Other abnormal findings in urine: Secondary | ICD-10-CM | POA: Diagnosis not present

## 2012-02-23 DIAGNOSIS — M899 Disorder of bone, unspecified: Secondary | ICD-10-CM | POA: Diagnosis not present

## 2012-03-01 DIAGNOSIS — E785 Hyperlipidemia, unspecified: Secondary | ICD-10-CM | POA: Diagnosis not present

## 2012-03-01 DIAGNOSIS — E119 Type 2 diabetes mellitus without complications: Secondary | ICD-10-CM | POA: Diagnosis not present

## 2012-03-01 DIAGNOSIS — Z0000 Encounter for general adult medical examination without abnormal findings: Secondary | ICD-10-CM | POA: Diagnosis not present

## 2012-03-01 DIAGNOSIS — R17 Unspecified jaundice: Secondary | ICD-10-CM | POA: Diagnosis not present

## 2012-03-01 DIAGNOSIS — Z124 Encounter for screening for malignant neoplasm of cervix: Secondary | ICD-10-CM | POA: Diagnosis not present

## 2012-03-02 DIAGNOSIS — Z1212 Encounter for screening for malignant neoplasm of rectum: Secondary | ICD-10-CM | POA: Diagnosis not present

## 2012-03-04 DIAGNOSIS — Z9010 Acquired absence of unspecified breast and nipple: Secondary | ICD-10-CM | POA: Diagnosis not present

## 2012-03-04 DIAGNOSIS — Z853 Personal history of malignant neoplasm of breast: Secondary | ICD-10-CM | POA: Diagnosis not present

## 2012-03-29 DIAGNOSIS — G4733 Obstructive sleep apnea (adult) (pediatric): Secondary | ICD-10-CM | POA: Diagnosis not present

## 2012-06-03 ENCOUNTER — Ambulatory Visit: Payer: Medicare Other | Admitting: Family

## 2012-06-03 ENCOUNTER — Other Ambulatory Visit: Payer: Medicare Other | Admitting: Lab

## 2012-06-14 ENCOUNTER — Ambulatory Visit (HOSPITAL_BASED_OUTPATIENT_CLINIC_OR_DEPARTMENT_OTHER): Payer: Medicare Other | Admitting: Family

## 2012-06-14 ENCOUNTER — Other Ambulatory Visit (HOSPITAL_BASED_OUTPATIENT_CLINIC_OR_DEPARTMENT_OTHER): Payer: Medicare Other | Admitting: Lab

## 2012-06-14 ENCOUNTER — Telehealth: Payer: Self-pay | Admitting: Oncology

## 2012-06-14 ENCOUNTER — Encounter: Payer: Self-pay | Admitting: Family

## 2012-06-14 VITALS — BP 190/81 | HR 69 | Temp 98.3°F | Ht 64.0 in | Wt 157.0 lb

## 2012-06-14 DIAGNOSIS — C50919 Malignant neoplasm of unspecified site of unspecified female breast: Secondary | ICD-10-CM

## 2012-06-14 DIAGNOSIS — D0590 Unspecified type of carcinoma in situ of unspecified breast: Secondary | ICD-10-CM | POA: Diagnosis not present

## 2012-06-14 DIAGNOSIS — Z853 Personal history of malignant neoplasm of breast: Secondary | ICD-10-CM

## 2012-06-14 LAB — CBC WITH DIFFERENTIAL/PLATELET
Eosinophils Absolute: 0.1 10*3/uL (ref 0.0–0.5)
HCT: 44.8 % (ref 34.8–46.6)
LYMPH%: 31.2 % (ref 14.0–49.7)
MCHC: 34.1 g/dL (ref 31.5–36.0)
MCV: 90.1 fL (ref 79.5–101.0)
MONO#: 0.5 10*3/uL (ref 0.1–0.9)
MONO%: 7.6 % (ref 0.0–14.0)
NEUT#: 4.1 10*3/uL (ref 1.5–6.5)
NEUT%: 59 % (ref 38.4–76.8)
Platelets: 201 10*3/uL (ref 145–400)
WBC: 6.9 10*3/uL (ref 3.9–10.3)

## 2012-06-14 LAB — COMPREHENSIVE METABOLIC PANEL
Alkaline Phosphatase: 69 U/L (ref 39–117)
CO2: 27 mEq/L (ref 19–32)
Creatinine, Ser: 0.7 mg/dL (ref 0.50–1.10)
Glucose, Bld: 114 mg/dL — ABNORMAL HIGH (ref 70–99)
Total Bilirubin: 1.4 mg/dL — ABNORMAL HIGH (ref 0.3–1.2)

## 2012-06-14 NOTE — Progress Notes (Signed)
CC: Breanna Neal. Breanna Neal, M.D.  Breanna Neal, M.D.  Breanna Neal, M.D.   DIAGNOSIS:  66 year old female with: 1. Stage I invasive ductal carcinoma with ductal carcinoma in situ of the right breast, status post mastectomy January 30, 2011 with immediate reconstruction with implant.  Tumor was ER positive 98%, PR positive 96%, Ki-67 11% and HER2/neu negative.   CURRENT THERAPY:  Aromasin 25 mg daily since 02/26/2011.  PAST THERAPY:   1. Right mastectomy 01/30/2011 with immediate reconstruction for a stage I invasive ductal carcinoma and DCIS.  INTERVAL HISTORY: Overall, doing well. Continues on Aromasin 25 mg daily with fair tolerance. Chief complaint is mild hot flashes (some 5-6/day), arthralgias and vaginal dryness.   No self-detected breast problems or complaints. Continues to see Breanna Neal for "fine tuning" of right reconstruction. Recently had nipple reconstruction, awaiting areolar tattoo. Had left mammogram November 17, 2012, no evidence of malignancy. Has mammograms at [facility].   No headache or blurred vision. No cough or shortness of breath. No abdominal pain or new bone pain. Bowel and bladder function are normal. Appetite is good, with adequate fluid intake. Has been told she has "white coat syndrome". Monitors blood pressure at home and it is consistently in the normal range. Remainder of the 10 point  review of systems is negative.   ALLERGIES:  No known drug allergies.  PERFORMANCE STATUS:  ECOG performance status: 0.  PHYSICAL EXAMINATION:  General: Well developed, well nourished, in no acute distress. Accompanied by her husband who is supportive and engaged. EENT: No ocular or oral lesions. No stomatitis.  Respiratory: Lungs are clear to auscultation bilaterally with normal respiratory movement and no accessory muscle use. Cardiac: No murmur, rub or tachycardia. No upper or lower extremity edema.  GI: Abdomen is soft, no palpable hepatosplenomegaly. No fluid wave. No  tenderness. Musculoskeletal: No kyphosis, no tenderness over the spine, ribs or hips. Lymph: No cervical, infraclavicular, axillary or inguinal adenopathy. Neuro: No focal neurological deficits. Psych: Alert and oriented X 3, appropriate mood and affect.  BREAST EXAM: In the supine position, with the right arm over the head, the right breast is reconstructed with implant. 6 cm well-healed incision in the upper aspect of the breast. Reconstructed nipple is everted, no areola. No nodularity of the skin or redness of the skin. No right axillary adenopathy. With the left arm over the head, the left nipple is everted. No periareolar edema or nipple discharge. No mass in any quadrant or subareolar region. No redness of the skin. No left axillary adenopathy. Left breast is more ptotic than reconstructed breast.    Filed Vitals:   06/14/12 1053  BP: 190/81  Pulse: 69  Temp: 98.3 F (36.8 C)     LABORATORY DATA:  Lab Results  Component Value Date   WBC 6.9 06/14/2012   HGB 15.2 06/14/2012   HCT 44.8 06/14/2012   MCV 90.1 06/14/2012   PLT 201 06/14/2012   IMPRESSION: 66 y/o with: 1. History of right breast cancer, Feb. 2012. No evidence of recurrence mammographically or clinically.  2. On Aromasin with fair tolerance.  3. Hot flashes associated with Aromasin. Not debilitating.  4. Vaginal dryness associated with Aromasin.  5. Arthralgias associated with Aromasin.  6. Hypertension in the office today, well controlled at home.   PLAN: 1. Continue Aromasin.  2. Vit E capsule vaginally for dryness.  3. Increase Vit. D to 2000 mg daily, may help with arthralgias.  4. Declines opportunity for Effexor for hot flashes.  5.Left mammogram Nov. 2013.  6. Follow-up with Dr. Welton Neal in 6 months. CBC has been normal the last 2 visits, will not be repeated at that visit.  7. Followup with Breanna Neal for completion of right reconstruction.

## 2012-06-14 NOTE — Telephone Encounter (Signed)
gve the pt her dec 2013 appt calendar °

## 2012-06-15 ENCOUNTER — Other Ambulatory Visit: Payer: Self-pay | Admitting: *Deleted

## 2012-07-06 DIAGNOSIS — M899 Disorder of bone, unspecified: Secondary | ICD-10-CM | POA: Diagnosis not present

## 2012-08-25 DIAGNOSIS — C50919 Malignant neoplasm of unspecified site of unspecified female breast: Secondary | ICD-10-CM | POA: Diagnosis not present

## 2012-08-26 DIAGNOSIS — N3020 Other chronic cystitis without hematuria: Secondary | ICD-10-CM | POA: Diagnosis not present

## 2012-08-26 DIAGNOSIS — R350 Frequency of micturition: Secondary | ICD-10-CM | POA: Diagnosis not present

## 2012-08-26 DIAGNOSIS — R300 Dysuria: Secondary | ICD-10-CM | POA: Diagnosis not present

## 2012-08-26 DIAGNOSIS — N318 Other neuromuscular dysfunction of bladder: Secondary | ICD-10-CM | POA: Diagnosis not present

## 2012-09-22 DIAGNOSIS — R3989 Other symptoms and signs involving the genitourinary system: Secondary | ICD-10-CM | POA: Diagnosis not present

## 2012-09-22 DIAGNOSIS — N318 Other neuromuscular dysfunction of bladder: Secondary | ICD-10-CM | POA: Diagnosis not present

## 2012-09-22 DIAGNOSIS — R350 Frequency of micturition: Secondary | ICD-10-CM | POA: Diagnosis not present

## 2012-10-29 DIAGNOSIS — Z1231 Encounter for screening mammogram for malignant neoplasm of breast: Secondary | ICD-10-CM | POA: Diagnosis not present

## 2012-11-23 DIAGNOSIS — Z800 Family history of malignant neoplasm of digestive organs: Secondary | ICD-10-CM | POA: Diagnosis not present

## 2012-11-23 DIAGNOSIS — Z1211 Encounter for screening for malignant neoplasm of colon: Secondary | ICD-10-CM | POA: Diagnosis not present

## 2012-12-08 ENCOUNTER — Other Ambulatory Visit: Payer: Self-pay | Admitting: *Deleted

## 2012-12-08 DIAGNOSIS — Z853 Personal history of malignant neoplasm of breast: Secondary | ICD-10-CM

## 2012-12-09 ENCOUNTER — Other Ambulatory Visit (HOSPITAL_BASED_OUTPATIENT_CLINIC_OR_DEPARTMENT_OTHER): Payer: Medicare Other | Admitting: Lab

## 2012-12-09 ENCOUNTER — Telehealth: Payer: Self-pay | Admitting: *Deleted

## 2012-12-09 ENCOUNTER — Ambulatory Visit (HOSPITAL_BASED_OUTPATIENT_CLINIC_OR_DEPARTMENT_OTHER): Payer: Medicare Other | Admitting: Oncology

## 2012-12-09 ENCOUNTER — Encounter: Payer: Self-pay | Admitting: Oncology

## 2012-12-09 VITALS — BP 158/90 | HR 74 | Temp 98.3°F | Resp 20 | Ht 64.0 in | Wt 159.1 lb

## 2012-12-09 DIAGNOSIS — C50919 Malignant neoplasm of unspecified site of unspecified female breast: Secondary | ICD-10-CM

## 2012-12-09 DIAGNOSIS — Z170 Estrogen receptor positive status [ER+]: Secondary | ICD-10-CM | POA: Diagnosis not present

## 2012-12-09 DIAGNOSIS — Z853 Personal history of malignant neoplasm of breast: Secondary | ICD-10-CM

## 2012-12-09 LAB — CBC WITH DIFFERENTIAL/PLATELET
BASO%: 0.6 % (ref 0.0–2.0)
EOS%: 1.5 % (ref 0.0–7.0)
HGB: 15.6 g/dL (ref 11.6–15.9)
MCH: 30.2 pg (ref 25.1–34.0)
MCHC: 34.5 g/dL (ref 31.5–36.0)
MCV: 87.6 fL (ref 79.5–101.0)
MONO%: 7 % (ref 0.0–14.0)
NEUT#: 4.1 10*3/uL (ref 1.5–6.5)
RBC: 5.16 10*6/uL (ref 3.70–5.45)
RDW: 12.3 % (ref 11.2–14.5)
WBC: 7.1 10*3/uL (ref 3.9–10.3)
lymph#: 2.4 10*3/uL (ref 0.9–3.3)

## 2012-12-09 LAB — COMPREHENSIVE METABOLIC PANEL (CC13)
AST: 29 U/L (ref 5–34)
Albumin: 3.9 g/dL (ref 3.5–5.0)
Alkaline Phosphatase: 85 U/L (ref 40–150)
BUN: 15 mg/dL (ref 7.0–26.0)
Potassium: 4.2 mEq/L (ref 3.5–5.1)
Sodium: 140 mEq/L (ref 136–145)
Total Bilirubin: 1.98 mg/dL — ABNORMAL HIGH (ref 0.20–1.20)
Total Protein: 7.2 g/dL (ref 6.4–8.3)

## 2012-12-09 NOTE — Patient Instructions (Addendum)
Doing well  We will see you back in 6 months 

## 2012-12-09 NOTE — Telephone Encounter (Signed)
Gave patient appointment for 05-2013 

## 2012-12-09 NOTE — Progress Notes (Signed)
CC: Breanna Neal. Ezzard Standing, M.D.  [name], M.D.  [name], M.D.   DIAGNOSIS:  66 year old female with: 1. Stage I invasive ductal carcinoma with ductal carcinoma in situ of the right breast status post mastectomy on January 30, 2011.  The tumor was ER positive 98%, PR positive 96%, Ki-67 11% and HER2/neu negative. 2. History of kidney infections. 3. Diabetes which is exercise and diet controlled.  CURRENT THERAPY:  The patient is on Aromasin 25 mg daily since 02/26/2011.  PAST THERAPY:   1. The patient underwent a mastectomy on 01/30/2011 with immediate reconstruction for a stage I invasive ductal carcinoma and DCIS. 2. She was then begun on Aromasin 25 mg daily on 02/26/2011. 3. The patient has had immediate reconstruction with implant  INTERVAL HISTORY: patient is seen in followup today overall she seems to be doing well. She is tolerating the Aromasin except for some mild aches and pains in her joints and some swelling of her hands but she tells me that it is very much tolerable. She does have ongoing vaginal dryness and she is not using the Estrace cream due to risk and concerns about breast cancer. And she says everything is basically tolerable. She is not experiencing many hot flashes. She denies any headaches double vision blurring of vision she has no fevers chills no night sweats. She denies any chest pains palpitations shortness of breath cough hemoptysis hematemesis she has no abnormal pain diarrhea or constipation. She has no breast tenderness. Remainder of the 10 point review of systems is negative.  ALLERGIES:  No known drug allergies.  CURRENT MEDICATIONS:  Current medications are reviewed and they are recorded separately in the electronic medical record  PERFORMANCE STATUS:  ECOG performance status is zero.  PHYSICAL EXAMINATION:  General:  The patient is awake, alert in no acute distress.  She appears well.   Filed Vitals:   12/09/12 1031  BP: 158/90  Pulse:  74  Temp: 98.3 F (36.8 C)  Resp: 20    HEENT Exam:  EOMI.  PERRLA.  Sclerae anicteric.  No conjunctival pallor.  Oral mucosa is moist.  Neck:  Supple.  Lungs: Clear bilaterally to auscultation and percussion.  Cardiovascular:  Regular rate and rhythm.  No murmurs, gallops or rubs.  Abdomen:  Soft, nontender, nondistended.  Bowel sounds are present.  No HSM.  Extremities:  Trace edema.  Neuro:  Patient is alert, oriented, otherwise nonfocal.  Breasts:  Left breast no masses or nipple discharge.  Right reconstructed breast.  No evidence of any infections.  There is no right upper extremity edema.  LABORATORY DATA:  Lab Results  Component Value Date   WBC 7.1 12/09/2012   HGB 15.6 12/09/2012   HCT 45.2 12/09/2012   MCV 87.6 12/09/2012   PLT 209 12/09/2012    IMPRESSION AND PLAN:  66 year old female with: 1. Stage I invasive ductal carcinoma with ductal carcinoma in situ status post mastectomy in February 2012 for an ER/PR positive, HER2/neu negative favorable breast cancer.  She had immediate reconstruction.   2. The patient is now on Aromasin 25 mg daily which she is tolerating quite well, and we will continue this, and she was given a prescription for this. the patient. 3. I will continue to see the patient every 6 months.  #5 patient and I had a long discussion regarding reconstructive procedures. She is planning on having some more reconstruction done on the right breast as well as nipple tattooing.  #6 I spent  30 minutes with the patient greater than 50% of the time was spent in counseling and coordination of care. Prescription for Aromasin was sent to her pharmacy.  #7 patient knows to call me with any problems questions or concerns. She can certainly be seen sooner if any problems arise.  Drue Second, MD Medical/Oncology [facility] [phone] (beeper) [phone] (Office)

## 2012-12-27 ENCOUNTER — Other Ambulatory Visit: Payer: Self-pay | Admitting: *Deleted

## 2012-12-27 MED ORDER — EXEMESTANE 25 MG PO TABS: 25.0000 mg | ORAL_TABLET | Freq: Every day | ORAL | Status: DC

## 2012-12-27 NOTE — Telephone Encounter (Signed)
Pt called requesting refill on Aromasin 25mg  to be sent to [facility]  Mail order pharmacy 90 day supply. Reviewed with MD VO ok to refill Aromasin 25mg  1 PO q Day. Refills 4. Pt's rx sent to [facility] per pt request.

## 2013-01-03 ENCOUNTER — Other Ambulatory Visit: Payer: Self-pay | Admitting: Oncology

## 2013-03-16 DIAGNOSIS — N952 Postmenopausal atrophic vaginitis: Secondary | ICD-10-CM | POA: Diagnosis not present

## 2013-03-16 DIAGNOSIS — R350 Frequency of micturition: Secondary | ICD-10-CM | POA: Diagnosis not present

## 2013-03-16 DIAGNOSIS — R3989 Other symptoms and signs involving the genitourinary system: Secondary | ICD-10-CM | POA: Diagnosis not present

## 2013-03-16 DIAGNOSIS — N3020 Other chronic cystitis without hematuria: Secondary | ICD-10-CM | POA: Diagnosis not present

## 2013-05-05 DIAGNOSIS — E119 Type 2 diabetes mellitus without complications: Secondary | ICD-10-CM | POA: Diagnosis not present

## 2013-05-05 DIAGNOSIS — R82998 Other abnormal findings in urine: Secondary | ICD-10-CM | POA: Diagnosis not present

## 2013-05-05 DIAGNOSIS — M949 Disorder of cartilage, unspecified: Secondary | ICD-10-CM | POA: Diagnosis not present

## 2013-05-05 DIAGNOSIS — M899 Disorder of bone, unspecified: Secondary | ICD-10-CM | POA: Diagnosis not present

## 2013-05-05 DIAGNOSIS — E785 Hyperlipidemia, unspecified: Secondary | ICD-10-CM | POA: Diagnosis not present

## 2013-05-11 DIAGNOSIS — E785 Hyperlipidemia, unspecified: Secondary | ICD-10-CM | POA: Diagnosis not present

## 2013-05-11 DIAGNOSIS — N952 Postmenopausal atrophic vaginitis: Secondary | ICD-10-CM | POA: Diagnosis not present

## 2013-05-11 DIAGNOSIS — E119 Type 2 diabetes mellitus without complications: Secondary | ICD-10-CM | POA: Diagnosis not present

## 2013-05-11 DIAGNOSIS — R82998 Other abnormal findings in urine: Secondary | ICD-10-CM | POA: Diagnosis not present

## 2013-05-11 DIAGNOSIS — N318 Other neuromuscular dysfunction of bladder: Secondary | ICD-10-CM | POA: Diagnosis not present

## 2013-05-11 DIAGNOSIS — Z0000 Encounter for general adult medical examination without abnormal findings: Secondary | ICD-10-CM | POA: Diagnosis not present

## 2013-05-11 DIAGNOSIS — M899 Disorder of bone, unspecified: Secondary | ICD-10-CM | POA: Diagnosis not present

## 2013-05-11 DIAGNOSIS — R030 Elevated blood-pressure reading, without diagnosis of hypertension: Secondary | ICD-10-CM | POA: Diagnosis not present

## 2013-05-11 DIAGNOSIS — N390 Urinary tract infection, site not specified: Secondary | ICD-10-CM | POA: Diagnosis not present

## 2013-05-11 DIAGNOSIS — Z1331 Encounter for screening for depression: Secondary | ICD-10-CM | POA: Diagnosis not present

## 2013-05-23 DIAGNOSIS — Z125 Encounter for screening for malignant neoplasm of prostate: Secondary | ICD-10-CM | POA: Diagnosis not present

## 2013-06-09 ENCOUNTER — Telehealth: Payer: Self-pay | Admitting: Oncology

## 2013-06-09 ENCOUNTER — Ambulatory Visit (HOSPITAL_BASED_OUTPATIENT_CLINIC_OR_DEPARTMENT_OTHER): Payer: Medicare Other | Admitting: Oncology

## 2013-06-09 ENCOUNTER — Other Ambulatory Visit (HOSPITAL_BASED_OUTPATIENT_CLINIC_OR_DEPARTMENT_OTHER): Payer: Medicare Other | Admitting: Lab

## 2013-06-09 ENCOUNTER — Other Ambulatory Visit: Payer: Self-pay | Admitting: Emergency Medicine

## 2013-06-09 VITALS — BP 173/90 | HR 72 | Temp 98.3°F | Resp 20 | Ht 64.0 in | Wt 154.3 lb

## 2013-06-09 DIAGNOSIS — Z853 Personal history of malignant neoplasm of breast: Secondary | ICD-10-CM | POA: Diagnosis not present

## 2013-06-09 DIAGNOSIS — C50919 Malignant neoplasm of unspecified site of unspecified female breast: Secondary | ICD-10-CM | POA: Diagnosis not present

## 2013-06-09 DIAGNOSIS — C50911 Malignant neoplasm of unspecified site of right female breast: Secondary | ICD-10-CM

## 2013-06-09 LAB — CBC WITH DIFFERENTIAL/PLATELET
Basophils Absolute: 0.1 10*3/uL (ref 0.0–0.1)
EOS%: 1 % (ref 0.0–7.0)
Eosinophils Absolute: 0.1 10*3/uL (ref 0.0–0.5)
HGB: 15.6 g/dL (ref 11.6–15.9)
LYMPH%: 30.8 % (ref 14.0–49.7)
MCH: 30.4 pg (ref 25.1–34.0)
MCV: 87.7 fL (ref 79.5–101.0)
MONO%: 6.5 % (ref 0.0–14.0)
NEUT#: 4.4 10*3/uL (ref 1.5–6.5)
Platelets: 217 10*3/uL (ref 145–400)
RDW: 13 % (ref 11.2–14.5)

## 2013-06-09 LAB — COMPREHENSIVE METABOLIC PANEL (CC13)
AST: 20 U/L (ref 5–34)
Albumin: 3.9 g/dL (ref 3.5–5.0)
Alkaline Phosphatase: 79 U/L (ref 40–150)
BUN: 11.4 mg/dL (ref 7.0–26.0)
Creatinine: 0.7 mg/dL (ref 0.6–1.1)
Glucose: 107 mg/dl — ABNORMAL HIGH (ref 70–99)
Potassium: 4.3 mEq/L (ref 3.5–5.1)
Total Bilirubin: 1.93 mg/dL — ABNORMAL HIGH (ref 0.20–1.20)

## 2013-06-09 NOTE — Telephone Encounter (Signed)
, °

## 2013-06-09 NOTE — Patient Instructions (Addendum)
Doing well no evidence of recurrent cancer  Continue aromasin 25mg  daily  I will see you back in 6 months

## 2013-07-03 NOTE — Progress Notes (Signed)
CC: Breanna Neal. Ezzard Standing, M.D.  [name], M.D.  [name], M.D.   DIAGNOSIS:  67 year old female with: 1. Stage I invasive ductal carcinoma with ductal carcinoma in situ of the right breast status post mastectomy on January 30, 2011.  The tumor was ER positive 98%, PR positive 96%, Ki-67 11% and HER2/neu negative. 2. History of kidney infections. 3. Diabetes which is exercise and diet controlled.  CURRENT THERAPY:  The patient is on Aromasin 25 mg daily since 02/26/2011.  PAST THERAPY:   1. The patient underwent a mastectomy on 01/30/2011 with immediate reconstruction for a stage I invasive ductal carcinoma and DCIS. 2. She was then begun on Aromasin 25 mg daily on 02/26/2011. 3. The patient has had immediate reconstruction with implant  INTERVAL HISTORY: patient is seen in followup today overall she seems to be doing well. She is tolerating the Aromasin except for some mild aches and pains in her joints and some swelling of her hands but she tells me that it is very much tolerable. She is not experiencing many hot flashes. She denies any headaches double vision blurring of vision she has no fevers chills no night sweats. She denies any chest pains palpitations shortness of breath cough hemoptysis hematemesis she has no abnormal pain diarrhea or constipation. She has no breast tenderness. Remainder of the 10 point review of systems is negative.  ALLERGIES:  No known drug allergies.  CURRENT MEDICATIONS:  Current medications are reviewed and they are recorded separately in the electronic medical record  PERFORMANCE STATUS:  ECOG performance status is zero.  PHYSICAL EXAMINATION:  General:  The patient is awake, alert in no acute distress.  She appears well.   Filed Vitals:   06/09/13 1034  BP: 173/90  Pulse: 72  Temp: 98.3 F (36.8 C)  Resp: 20    HEENT Exam:  EOMI.  PERRLA.  Sclerae anicteric.  No conjunctival pallor.  Oral mucosa is moist.  Neck:  Supple.  Lungs: Clear  bilaterally to auscultation and percussion.  Cardiovascular:  Regular rate and rhythm.  No murmurs, gallops or rubs.  Abdomen:  Soft, nontender, nondistended.  Bowel sounds are present.  No HSM.  Extremities:  Trace edema.  Neuro:  Patient is alert, oriented, otherwise nonfocal.  Breasts:  Left breast no masses or nipple discharge.  Right reconstructed breast.  No evidence of any infections.  There is no right upper extremity edema.  LABORATORY DATA:  Lab Results  Component Value Date   WBC 7.3 06/09/2013   HGB 15.6 06/09/2013   HCT 44.9 06/09/2013   MCV 87.7 06/09/2013   PLT 217 06/09/2013    IMPRESSION AND PLAN:  67 year old female with: 1. Stage I invasive ductal carcinoma with ductal carcinoma in situ status post mastectomy in February 2012 for an ER/PR positive, HER2/neu negative favorable breast cancer.  She had immediate reconstruction.   2. The patient is now on Aromasin 25 mg daily which she is tolerating quite well, and we will continue this, and she was given a prescription for this. the patient. 3. I will continue to see the patient every 6 months.   #4 I spent 30 minutes with the patient greater than 50% of the time was spent in counseling and coordination of care. Prescription for Aromasin was sent to her pharmacy.  #6 patient knows to call me with any problems questions or concerns. She can certainly be seen sooner if any problems arise.  Drue Second, MD Medical/Oncology [facility] [phone] (beeper) [phone] (Office)

## 2013-07-19 DIAGNOSIS — H524 Presbyopia: Secondary | ICD-10-CM | POA: Diagnosis not present

## 2013-07-19 DIAGNOSIS — H26019 Infantile and juvenile cortical, lamellar, or zonular cataract, unspecified eye: Secondary | ICD-10-CM | POA: Diagnosis not present

## 2013-07-27 ENCOUNTER — Other Ambulatory Visit: Payer: Self-pay

## 2013-08-17 DIAGNOSIS — M899 Disorder of bone, unspecified: Secondary | ICD-10-CM | POA: Diagnosis not present

## 2013-08-17 DIAGNOSIS — Z79899 Other long term (current) drug therapy: Secondary | ICD-10-CM | POA: Diagnosis not present

## 2013-09-08 DIAGNOSIS — L03019 Cellulitis of unspecified finger: Secondary | ICD-10-CM | POA: Diagnosis not present

## 2013-09-08 DIAGNOSIS — Z6827 Body mass index (BMI) 27.0-27.9, adult: Secondary | ICD-10-CM | POA: Diagnosis not present

## 2013-09-08 DIAGNOSIS — C50919 Malignant neoplasm of unspecified site of unspecified female breast: Secondary | ICD-10-CM | POA: Diagnosis not present

## 2013-09-12 DIAGNOSIS — L03019 Cellulitis of unspecified finger: Secondary | ICD-10-CM | POA: Diagnosis not present

## 2013-09-13 DIAGNOSIS — L03019 Cellulitis of unspecified finger: Secondary | ICD-10-CM | POA: Diagnosis not present

## 2013-09-13 DIAGNOSIS — M19049 Primary osteoarthritis, unspecified hand: Secondary | ICD-10-CM | POA: Diagnosis not present

## 2013-09-26 DIAGNOSIS — L03019 Cellulitis of unspecified finger: Secondary | ICD-10-CM | POA: Diagnosis not present

## 2013-10-27 ENCOUNTER — Other Ambulatory Visit: Payer: Self-pay

## 2013-11-24 DIAGNOSIS — Z1231 Encounter for screening mammogram for malignant neoplasm of breast: Secondary | ICD-10-CM | POA: Diagnosis not present

## 2013-11-24 DIAGNOSIS — Z803 Family history of malignant neoplasm of breast: Secondary | ICD-10-CM | POA: Diagnosis not present

## 2013-12-01 ENCOUNTER — Telehealth: Payer: Self-pay | Admitting: Oncology

## 2013-12-01 ENCOUNTER — Other Ambulatory Visit (HOSPITAL_BASED_OUTPATIENT_CLINIC_OR_DEPARTMENT_OTHER): Payer: Medicare Other

## 2013-12-01 ENCOUNTER — Ambulatory Visit (HOSPITAL_BASED_OUTPATIENT_CLINIC_OR_DEPARTMENT_OTHER): Payer: Medicare Other | Admitting: Oncology

## 2013-12-01 VITALS — BP 187/78 | HR 77 | Temp 98.3°F | Resp 18 | Ht 64.0 in | Wt 159.0 lb

## 2013-12-01 DIAGNOSIS — C50919 Malignant neoplasm of unspecified site of unspecified female breast: Secondary | ICD-10-CM | POA: Diagnosis not present

## 2013-12-01 DIAGNOSIS — Z170 Estrogen receptor positive status [ER+]: Secondary | ICD-10-CM

## 2013-12-01 DIAGNOSIS — C50911 Malignant neoplasm of unspecified site of right female breast: Secondary | ICD-10-CM

## 2013-12-01 LAB — CBC WITH DIFFERENTIAL/PLATELET
Basophils Absolute: 0.1 10*3/uL (ref 0.0–0.1)
EOS%: 1.2 % (ref 0.0–7.0)
Eosinophils Absolute: 0.1 10*3/uL (ref 0.0–0.5)
HCT: 46.8 % — ABNORMAL HIGH (ref 34.8–46.6)
HGB: 15.8 g/dL (ref 11.6–15.9)
LYMPH%: 34.5 % (ref 14.0–49.7)
MCH: 30.3 pg (ref 25.1–34.0)
MCV: 89.5 fL (ref 79.5–101.0)
MONO%: 7.5 % (ref 0.0–14.0)
NEUT#: 4.2 10*3/uL (ref 1.5–6.5)
NEUT%: 55.9 % (ref 38.4–76.8)
Platelets: 226 10*3/uL (ref 145–400)
RDW: 13.1 % (ref 11.2–14.5)

## 2013-12-01 LAB — COMPREHENSIVE METABOLIC PANEL (CC13)
AST: 29 U/L (ref 5–34)
Albumin: 4.1 g/dL (ref 3.5–5.0)
Alkaline Phosphatase: 79 U/L (ref 40–150)
BUN: 11.7 mg/dL (ref 7.0–26.0)
Creatinine: 0.8 mg/dL (ref 0.6–1.1)
Glucose: 114 mg/dl (ref 70–140)
Potassium: 4 mEq/L (ref 3.5–5.1)

## 2013-12-01 NOTE — Telephone Encounter (Signed)
, °

## 2013-12-08 NOTE — Progress Notes (Signed)
CC: Sandria Bales. Ezzard Standing, M.D.  [name], M.D.  [name], M.D.   DIAGNOSIS:  67 year old female with: 1. Stage I invasive ductal carcinoma with ductal carcinoma in situ of the right breast status post mastectomy on January 30, 2011.  The tumor was ER positive 98%, PR positive 96%, Ki-67 11% and HER2/neu negative. 2. History of kidney infections. 3. Diabetes which is exercise and diet controlled.  CURRENT THERAPY:  The patient is on Aromasin 25 mg daily since 02/26/2011.  PAST THERAPY:   1. The patient underwent a mastectomy on 01/30/2011 with immediate reconstruction for a stage I invasive ductal carcinoma and DCIS. 2. She was then begun on Aromasin 25 mg daily on 02/26/2011. 3. The patient has had immediate reconstruction with implant  INTERVAL HISTORY: patient is seen in followup today overall she seems to be doing well. She is tolerating the Aromasin except for some mild aches and pains in her joints and some swelling of her hands but she tells me that it is very much tolerable. She is not experiencing many hot flashes. She denies any headaches double vision blurring of vision she has no fevers chills no night sweats. She denies any chest pains palpitations shortness of breath cough hemoptysis hematemesis she has no abnormal pain diarrhea or constipation. She has no breast tenderness. Remainder of the 10 point review of systems is negative.  ALLERGIES:  No known drug allergies.  CURRENT MEDICATIONS:  Current medications are reviewed and they are recorded separately in the electronic medical record  PERFORMANCE STATUS:  ECOG performance status is zero.  PHYSICAL EXAMINATION:  General:  The patient is awake, alert in no acute distress.  She appears well.   Filed Vitals:   12/01/13 1101  BP: 187/78  Pulse: 77  Temp: 98.3 F (36.8 C)  Resp: 18    HEENT Exam:  EOMI.  PERRLA.  Sclerae anicteric.  No conjunctival pallor.  Oral mucosa is moist.  Neck:  Supple.  Lungs: Clear  bilaterally to auscultation and percussion.  Cardiovascular:  Regular rate and rhythm.  No murmurs, gallops or rubs.  Abdomen:  Soft, nontender, nondistended.  Bowel sounds are present.  No HSM.  Extremities:  Trace edema.  Neuro:  Patient is alert, oriented, otherwise nonfocal.  Breasts:  Left breast no masses or nipple discharge.  Right reconstructed breast.  No evidence of any infections.  There is no right upper extremity edema.  LABORATORY DATA:  Lab Results  Component Value Date   WBC 7.5 12/01/2013   HGB 15.8 12/01/2013   HCT 46.8* 12/01/2013   MCV 89.5 12/01/2013   PLT 226 12/01/2013    IMPRESSION AND PLAN:  67 year old female with: 1. Stage I invasive ductal carcinoma with ductal carcinoma in situ status post mastectomy in February 2012 for an ER/PR positive, HER2/neu negative favorable breast cancer.  She had immediate reconstruction.   2. The patient is now on Aromasin 25 mg daily which she is tolerating quite well, and we will continue this, and she was given a prescription for this. the patient. 3. I will continue to see the patient every 6 months.   I spent  with the patient greater than 50% of the time was spent in counseling and coordination of care. Prescription for Aromasin was sent to her pharmacy.  patient knows to call me with any problems questions or concerns. She can certainly be seen sooner if any problems arise.  Drue Second, MD Medical/Oncology [facility] [phone]  beeper) [phone] (Office)

## 2014-01-03 ENCOUNTER — Other Ambulatory Visit: Payer: Self-pay | Admitting: *Deleted

## 2014-01-03 DIAGNOSIS — C50919 Malignant neoplasm of unspecified site of unspecified female breast: Secondary | ICD-10-CM

## 2014-01-03 MED ORDER — EXEMESTANE 25 MG PO TABS: 25.0000 mg | ORAL_TABLET | Freq: Every day | ORAL | Status: DC

## 2014-03-22 DIAGNOSIS — N3020 Other chronic cystitis without hematuria: Secondary | ICD-10-CM | POA: Diagnosis not present

## 2014-03-22 DIAGNOSIS — R350 Frequency of micturition: Secondary | ICD-10-CM | POA: Diagnosis not present

## 2014-03-23 ENCOUNTER — Other Ambulatory Visit: Payer: Self-pay | Admitting: *Deleted

## 2014-03-23 DIAGNOSIS — C50919 Malignant neoplasm of unspecified site of unspecified female breast: Secondary | ICD-10-CM

## 2014-03-23 MED ORDER — EXEMESTANE 25 MG PO TABS
25.0000 mg | ORAL_TABLET | Freq: Every day | ORAL | Status: DC
Start: 2014-03-23 — End: 2014-12-04

## 2014-03-24 NOTE — Progress Notes (Signed)
Faxed Rx request dtd 03/23/14 to [facility].  Sent to scan.

## 2014-04-21 DIAGNOSIS — N3020 Other chronic cystitis without hematuria: Secondary | ICD-10-CM | POA: Diagnosis not present

## 2014-05-25 DIAGNOSIS — E119 Type 2 diabetes mellitus without complications: Secondary | ICD-10-CM | POA: Diagnosis not present

## 2014-05-25 DIAGNOSIS — R82998 Other abnormal findings in urine: Secondary | ICD-10-CM | POA: Diagnosis not present

## 2014-05-25 DIAGNOSIS — M949 Disorder of cartilage, unspecified: Secondary | ICD-10-CM | POA: Diagnosis not present

## 2014-05-25 DIAGNOSIS — M899 Disorder of bone, unspecified: Secondary | ICD-10-CM | POA: Diagnosis not present

## 2014-05-25 DIAGNOSIS — E785 Hyperlipidemia, unspecified: Secondary | ICD-10-CM | POA: Diagnosis not present

## 2014-05-30 ENCOUNTER — Telehealth: Payer: Self-pay | Admitting: Oncology

## 2014-05-30 NOTE — Telephone Encounter (Signed)
, °

## 2014-06-01 DIAGNOSIS — M949 Disorder of cartilage, unspecified: Secondary | ICD-10-CM | POA: Diagnosis not present

## 2014-06-01 DIAGNOSIS — Z1331 Encounter for screening for depression: Secondary | ICD-10-CM | POA: Diagnosis not present

## 2014-06-01 DIAGNOSIS — M899 Disorder of bone, unspecified: Secondary | ICD-10-CM | POA: Diagnosis not present

## 2014-06-01 DIAGNOSIS — Z0000 Encounter for general adult medical examination without abnormal findings: Secondary | ICD-10-CM | POA: Diagnosis not present

## 2014-06-01 DIAGNOSIS — Z23 Encounter for immunization: Secondary | ICD-10-CM | POA: Diagnosis not present

## 2014-06-01 DIAGNOSIS — Z1212 Encounter for screening for malignant neoplasm of rectum: Secondary | ICD-10-CM | POA: Diagnosis not present

## 2014-06-01 DIAGNOSIS — R809 Proteinuria, unspecified: Secondary | ICD-10-CM | POA: Diagnosis not present

## 2014-06-01 DIAGNOSIS — Z6827 Body mass index (BMI) 27.0-27.9, adult: Secondary | ICD-10-CM | POA: Diagnosis not present

## 2014-06-01 DIAGNOSIS — E119 Type 2 diabetes mellitus without complications: Secondary | ICD-10-CM | POA: Diagnosis not present

## 2014-06-01 DIAGNOSIS — N390 Urinary tract infection, site not specified: Secondary | ICD-10-CM | POA: Diagnosis not present

## 2014-06-01 DIAGNOSIS — N318 Other neuromuscular dysfunction of bladder: Secondary | ICD-10-CM | POA: Diagnosis not present

## 2014-06-01 DIAGNOSIS — E785 Hyperlipidemia, unspecified: Secondary | ICD-10-CM | POA: Diagnosis not present

## 2014-06-05 ENCOUNTER — Ambulatory Visit: Payer: Medicare Other | Admitting: Oncology

## 2014-06-05 ENCOUNTER — Other Ambulatory Visit: Payer: Medicare Other

## 2014-08-01 DIAGNOSIS — H43399 Other vitreous opacities, unspecified eye: Secondary | ICD-10-CM | POA: Diagnosis not present

## 2014-08-01 DIAGNOSIS — H524 Presbyopia: Secondary | ICD-10-CM | POA: Diagnosis not present

## 2014-08-01 DIAGNOSIS — H26019 Infantile and juvenile cortical, lamellar, or zonular cataract, unspecified eye: Secondary | ICD-10-CM | POA: Diagnosis not present

## 2014-08-30 DIAGNOSIS — M949 Disorder of cartilage, unspecified: Secondary | ICD-10-CM | POA: Diagnosis not present

## 2014-08-30 DIAGNOSIS — M899 Disorder of bone, unspecified: Secondary | ICD-10-CM | POA: Diagnosis not present

## 2014-09-01 ENCOUNTER — Other Ambulatory Visit: Payer: Self-pay

## 2014-09-01 DIAGNOSIS — Z853 Personal history of malignant neoplasm of breast: Secondary | ICD-10-CM

## 2014-09-04 ENCOUNTER — Other Ambulatory Visit (HOSPITAL_BASED_OUTPATIENT_CLINIC_OR_DEPARTMENT_OTHER): Payer: Medicare Other

## 2014-09-04 ENCOUNTER — Telehealth: Payer: Self-pay | Admitting: Hematology and Oncology

## 2014-09-04 ENCOUNTER — Encounter: Payer: Self-pay | Admitting: Hematology and Oncology

## 2014-09-04 ENCOUNTER — Ambulatory Visit (HOSPITAL_BASED_OUTPATIENT_CLINIC_OR_DEPARTMENT_OTHER): Payer: Medicare Other | Admitting: Hematology and Oncology

## 2014-09-04 DIAGNOSIS — Z853 Personal history of malignant neoplasm of breast: Secondary | ICD-10-CM | POA: Diagnosis not present

## 2014-09-04 LAB — CBC WITH DIFFERENTIAL/PLATELET
BASO%: 0.7 % (ref 0.0–2.0)
BASOS ABS: 0.1 10*3/uL (ref 0.0–0.1)
EOS ABS: 0.1 10*3/uL (ref 0.0–0.5)
EOS%: 1.7 % (ref 0.0–7.0)
HCT: 49.3 % — ABNORMAL HIGH (ref 34.8–46.6)
HGB: 16.4 g/dL — ABNORMAL HIGH (ref 11.6–15.9)
LYMPH#: 2.6 10*3/uL (ref 0.9–3.3)
LYMPH%: 28.7 % (ref 14.0–49.7)
MCH: 29.8 pg (ref 25.1–34.0)
MCHC: 33.2 g/dL (ref 31.5–36.0)
MCV: 89.7 fL (ref 79.5–101.0)
MONO#: 0.5 10*3/uL (ref 0.1–0.9)
MONO%: 6.2 % (ref 0.0–14.0)
NEUT#: 5.6 10*3/uL (ref 1.5–6.5)
NEUT%: 62.7 % (ref 38.4–76.8)
Platelets: 224 10*3/uL (ref 145–400)
RBC: 5.5 10*6/uL — AB (ref 3.70–5.45)
RDW: 12.7 % (ref 11.2–14.5)
WBC: 8.9 10*3/uL (ref 3.9–10.3)

## 2014-09-04 LAB — COMPREHENSIVE METABOLIC PANEL (CC13)
ALBUMIN: 4.2 g/dL (ref 3.5–5.0)
ALT: 26 U/L (ref 0–55)
AST: 21 U/L (ref 5–34)
Alkaline Phosphatase: 80 U/L (ref 40–150)
Anion Gap: 10 mEq/L (ref 3–11)
BUN: 15.5 mg/dL (ref 7.0–26.0)
CALCIUM: 10 mg/dL (ref 8.4–10.4)
CHLORIDE: 104 meq/L (ref 98–109)
CO2: 27 mEq/L (ref 22–29)
Creatinine: 0.8 mg/dL (ref 0.6–1.1)
GLUCOSE: 125 mg/dL (ref 70–140)
Potassium: 4.6 mEq/L (ref 3.5–5.1)
SODIUM: 141 meq/L (ref 136–145)
Total Bilirubin: 1.66 mg/dL — ABNORMAL HIGH (ref 0.20–1.20)
Total Protein: 7.6 g/dL (ref 6.4–8.3)

## 2014-09-04 NOTE — Progress Notes (Signed)
Patient Care Team: Jerlyn Ly, MD as PCP - General (Internal Medicine) Lorelle Gibbs, MD as Consulting Physician (Radiology) Peri Maris, MD as Consulting Physician (Obstetrics and Gynecology) Crissie Reese, MD as Consulting Physician (Plastic Surgery) Deatra Robinson, MD as Consulting Physician (Internal Medicine)  DIAGNOSIS: Breast cancer of lower-inner quadrant of right female breast   Primary site: Breast (Right)   Staging method: AJCC 7th Edition   Pathologic: Stage IA (T1a, N0, cM0) signed by Rulon Eisenmenger, MD on 09/04/2014  4:59 PM   Summary: Stage IA (T1a, N0, cM0)   SUMMARY OF ONCOLOGIC HISTORY:   Breast cancer of lower-inner quadrant of right female breast   12/31/2010 Initial Diagnosis Invasive ductal carcinoma grade 1 ER 90% PR 96% Ki-67 11% HER-2 negative ratio 1.08   01/30/2011 Surgery Right breast mastectomy: No invasive cancer, high-grade DCIS with comedonecrosis 3 lymph nodes negative: Followed by immediate reconstruction with implant   02/26/2011 -  Anti-estrogen oral therapy Aromasin 25 mg daily    CHIEF COMPLIANT: Six-month followup of history of breast cancer  INTERVAL HISTORY: [name] is a 68 year old lady with above-mentioned history of right breast cancer treated with right mastectomy. She had been on oral antiestrogen therapy since March 2012. With Aromasin. She has been tolerating it very well without any major problems or concerns. She is very occasional hot flashes but otherwise without any problems.  REVIEW OF SYSTEMS:   Constitutional: Denies fevers, chills or abnormal weight loss Eyes: Denies blurriness of vision Ears, nose, mouth, throat, and face: Denies mucositis or sore throat Respiratory: Denies cough, dyspnea or wheezes Cardiovascular: Denies palpitation, chest discomfort or lower extremity swelling Gastrointestinal:  Denies nausea, heartburn or change in bowel habits Skin: Denies abnormal skin rashes Lymphatics: Denies new  lymphadenopathy or easy bruising Neurological:Denies numbness, tingling or new weaknesses Behavioral/Psych: Mood is stable, no new changes  Breast: denies any pain or lumps or nodules in either breasts All other systems were reviewed with the patient and are negative.  I have reviewed the past medical history, past surgical history, social history and family history with the patient and they are unchanged from previous note.  ALLERGIES:  has No Known Allergies.  MEDICATIONS:  Current Outpatient Prescriptions  Medication Sig Dispense Refill  . aspirin 81 MG chewable tablet Chew 81 mg by mouth 2 (two) times a week.       Marland Kitchen atorvastatin (LIPITOR) 80 MG tablet Take 40 mg by mouth daily.       . Calcium Carbonate-Vitamin D (CALCIUM 600 + D PO) Take by mouth daily.       . Cholecalciferol (VITAMIN D) 1000 UNITS capsule Take 5,000 Units by mouth once a week.       Marland Kitchen exemestane (AROMASIN) 25 MG tablet Take 1 tablet (25 mg total) by mouth daily.  90 tablet  2  . fexofenadine (ALLEGRA) 30 MG tablet Take 30 mg by mouth as needed.       . Omega-3 Fatty Acids (OMEGA-3 FISH OIL) 1200 MG CAPS Take 2 capsules by mouth daily.       Marland Kitchen TRIMETHOPRIM PO Take 100 mg by mouth as needed.        No current facility-administered medications for this visit.    PHYSICAL EXAMINATION: ECOG PERFORMANCE STATUS: 0 - Asymptomatic  There were no vitals filed for this visit. There were no vitals filed for this visit.  GENERAL:alert, no distress and comfortable SKIN: skin color, texture, turgor are normal, no rashes or significant  lesions EYES: normal, Conjunctiva are pink and non-injected, sclera clear OROPHARYNX:no exudate, no erythema and lips, buccal mucosa, and tongue normal  NECK: supple, thyroid normal size, non-tender, without nodularity LYMPH:  no palpable lymphadenopathy in the cervical, axillary or inguinal LUNGS: clear to auscultation and percussion with normal breathing effort HEART: regular rate &  rhythm and no murmurs and no lower extremity edema ABDOMEN:abdomen soft, non-tender and normal bowel sounds Musculoskeletal:no cyanosis of digits and no clubbing  NEURO: alert & oriented x 3 with fluent speech, no focal motor/sensory deficits BREAST: No palpable masses or nodules in either right or left breasts. No palpable axillary supraclavicular or infraclavicular adenopathy no breast tenderness or nipple discharge.   LABORATORY DATA:  I have reviewed the data as listed   Chemistry      Component Value Date/Time   NA 141 09/04/2014 1336   NA 139 06/14/2012 1036   K 4.6 09/04/2014 1336   K 4.2 06/14/2012 1036   CL 105 06/09/2013 1024   CL 104 06/14/2012 1036   CO2 27 09/04/2014 1336   CO2 27 06/14/2012 1036   BUN 15.5 09/04/2014 1336   BUN 13 06/14/2012 1036   CREATININE 0.8 09/04/2014 1336   CREATININE 0.70 06/14/2012 1036      Component Value Date/Time   CALCIUM 10.0 09/04/2014 1336   CALCIUM 9.5 06/14/2012 1036   ALKPHOS 80 09/04/2014 1336   ALKPHOS 69 06/14/2012 1036   AST 21 09/04/2014 1336   AST 21 06/14/2012 1036   ALT 26 09/04/2014 1336   ALT 27 06/14/2012 1036   BILITOT 1.66* 09/04/2014 1336   BILITOT 1.4* 06/14/2012 1036       Lab Results  Component Value Date   WBC 8.9 09/04/2014   HGB 16.4* 09/04/2014   HCT 49.3* 09/04/2014   MCV 89.7 09/04/2014   PLT 224 09/04/2014   NEUTROABS 5.6 09/04/2014     RADIOGRAPHIC STUDIES: I have personally reviewed the radiology reports and agreed with their findings. No results found.   ASSESSMENT & PLAN:  Breast cancer of lower-inner quadrant of right female breast Stage I invasive ductal carcinoma with DCIS right breast: Initial biopsy showed invasive ductal carcinoma of the final mastectomy only showed DCIS ER/PR positive HER-2 negative: Currently on antiestrogen therapy with Aromasin and tolerating it very well without any major problems or concerns we will continue to treat her with Aromasin and we'll see her back every 6 months for  followup.  Patient will need a mammogram in December 2015 and we'll follow up after that with a breast exam and that time.  Continuing to monitor her for toxicities related to antiestrogen therapy.  Elevated hemoglobin: Patient does not have any signs and symptoms of heart disease or lung disease. She does not have any symptoms related to the high hemoglobin hence we can continue to watch and monitor this for now.    Orders Placed This Encounter  Procedures  . MM Digital Diagnostic Unilat L    Standing Status: Future     Number of Occurrences:      Standing Expiration Date: 09/04/2015    Order Specific Question:  Reason for Exam (SYMPTOM  OR DIAGNOSIS REQUIRED)    Answer:  H/O Right breast cancer. Annual follow up on Left    Order Specific Question:  Preferred imaging location?    Answer:  External     Comments:  [place]   The patient has a good understanding of the overall plan. she agrees with it. She  will call with any problems that may develop before her next visit here.  I spent 20 minutes counseling the patient face to face. The total time spent in the appointment was 25 minutes and more than 50% was on counseling and review of test results    Rulon Eisenmenger, MD 09/04/2014 5:06 PM

## 2014-09-04 NOTE — Assessment & Plan Note (Signed)
Stage I invasive ductal carcinoma with DCIS right breast: Initial biopsy showed invasive ductal carcinoma of the final mastectomy only showed DCIS ER/PR positive HER-2 negative: Currently on antiestrogen therapy with Aromasin and tolerating it very well without any major problems or concerns we will continue to treat her with Aromasin and we'll see her back every 6 months for followup.  Patient will need a mammogram in December 2015 and we'll follow up after that with a breast exam and that time.  Continuing to monitor her for toxicities related to antiestrogen therapy.

## 2014-09-05 ENCOUNTER — Telehealth: Payer: Self-pay

## 2014-09-05 NOTE — Telephone Encounter (Signed)
Bone density report rcvd from [place] 08/30/14, Dr. Joylene Draft.  Sent to scan.

## 2014-11-27 DIAGNOSIS — Z853 Personal history of malignant neoplasm of breast: Secondary | ICD-10-CM | POA: Diagnosis not present

## 2014-12-04 ENCOUNTER — Telehealth: Payer: Self-pay | Admitting: Hematology and Oncology

## 2014-12-04 ENCOUNTER — Ambulatory Visit (HOSPITAL_BASED_OUTPATIENT_CLINIC_OR_DEPARTMENT_OTHER): Payer: Medicare Other | Admitting: Hematology and Oncology

## 2014-12-04 ENCOUNTER — Telehealth: Payer: Self-pay | Admitting: *Deleted

## 2014-12-04 ENCOUNTER — Other Ambulatory Visit: Payer: Self-pay | Admitting: *Deleted

## 2014-12-04 VITALS — BP 157/77 | HR 71 | Temp 98.5°F | Resp 18 | Ht 64.0 in | Wt 155.3 lb

## 2014-12-04 DIAGNOSIS — M899 Disorder of bone, unspecified: Secondary | ICD-10-CM | POA: Diagnosis not present

## 2014-12-04 DIAGNOSIS — D751 Secondary polycythemia: Secondary | ICD-10-CM

## 2014-12-04 DIAGNOSIS — Z853 Personal history of malignant neoplasm of breast: Secondary | ICD-10-CM | POA: Diagnosis not present

## 2014-12-04 DIAGNOSIS — C50311 Malignant neoplasm of lower-inner quadrant of right female breast: Secondary | ICD-10-CM

## 2014-12-04 MED ORDER — ANASTROZOLE 1 MG PO TABS: 1.0000 mg | ORAL_TABLET | Freq: Every day | ORAL | Status: DC

## 2014-12-04 NOTE — Telephone Encounter (Signed)
Results of mammogram received from [facility]. Sent to scan.

## 2014-12-04 NOTE — Telephone Encounter (Signed)
, °

## 2014-12-04 NOTE — Assessment & Plan Note (Signed)
Mildly elevated hemoglobin: Patient is asymptomatic. I did not initiate an extensive workup at this time to determine whether it is primary or secondary since the hemoglobin levels are not significantly increased.

## 2014-12-04 NOTE — Assessment & Plan Note (Addendum)
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  Aromasin toxicities: No major side effects Aromasin continuing to monitor her for side effects. Surveillance for breast cancer: 1. Breast exam 09/04/2014 normal 2. Mammogram done at [facility] 3. DEXA scan 08/30/2014: Osteopenia T score -1.6: Continue with calcium and vitamin D. Patient's primary care physician has been ordering this DEXA scans. We will follow up on subsequent results. Patient is paying $500 for Aromasin prescription. I discussed that we will switch her to Arimidex. They're all equivalent.  No clinical or radiological evidence of disease relapse.  Return to clinic in 6 months for follow-up.

## 2014-12-04 NOTE — Progress Notes (Signed)
Patient Care Team: Jerlyn Ly, MD as PCP - General (Internal Medicine) Lorelle Gibbs, MD as Consulting Physician (Radiology) Peri Maris, MD as Consulting Physician (Obstetrics and Gynecology) Crissie Reese, MD as Consulting Physician (Plastic Surgery) Deatra Robinson, MD as Consulting Physician (Internal Medicine)  DIAGNOSIS: Breast cancer of lower-inner quadrant of right female breast   Staging form: Breast, AJCC 7th Edition     Clinical: No stage assigned - Unsigned     Pathologic: Stage IA (T1a, N0, cM0) - Signed by Rulon Eisenmenger, MD on 09/04/2014   SUMMARY OF ONCOLOGIC HISTORY:   Breast cancer of lower-inner quadrant of right female breast   12/31/2010 Initial Diagnosis Invasive ductal carcinoma grade 1 ER 90% PR 96% Ki-67 11% HER-2 negative ratio 1.08   01/30/2011 Surgery Right breast mastectomy: No invasive cancer, high-grade DCIS with comedonecrosis 3 lymph nodes negative: Followed by immediate reconstruction with implant   05/29/2011 -  Anti-estrogen oral therapy Aromasin 25 mg daily, due to expense number changed to Arimidex 1 mg daily 12/04/2014    CHIEF COMPLIANT: follow-up of breast cancer  INTERVAL HISTORY: Breanna Neal is a 69 year old Caucasian with above-mentioned history of right breast cancer treated with mastectomy and is on adjuvant antiestrogen therapy. She has been on Aromasin since June 2012. She has been tolerating it extremely well apart from vaginal dryness and muscle aches and pains. She had recent mammogram and bone density and see her today to discuss the results. Denies any new lumps or nodules in the breasts.  REVIEW OF SYSTEMS:   Constitutional: Denies fevers, chills or abnormal weight loss Eyes: Denies blurriness of vision Ears, nose, mouth, throat, and face: Denies mucositis or sore throat Respiratory: Denies cough, dyspnea or wheezes Cardiovascular: Denies palpitation, chest discomfort or lower extremity swelling Gastrointestinal:   Denies nausea, heartburn or change in bowel habits Skin: Denies abnormal skin rashes Lymphatics: Denies new lymphadenopathy or easy bruising Neurological:Denies numbness, tingling or new weaknesses Behavioral/Psych: Mood is stable, no new changes  Breast:  denies any pain or lumps or nodules in either breasts All other systems were reviewed with the patient and are negative.  I have reviewed the past medical history, past surgical history, social history and family history with the patient and they are unchanged from previous note.  ALLERGIES:  has No Known Allergies.  MEDICATIONS:  Current Outpatient Prescriptions  Medication Sig Dispense Refill  . aspirin 81 MG chewable tablet Chew 81 mg by mouth 2 (two) times a week.     Marland Kitchen atorvastatin (LIPITOR) 80 MG tablet Take 40 mg by mouth daily.     . Calcium Carbonate-Vitamin D (CALCIUM 600 + D PO) Take by mouth daily.     . Cholecalciferol (VITAMIN D) 1000 UNITS capsule Take 5,000 Units by mouth once a week.     . fexofenadine (ALLEGRA) 30 MG tablet Take 30 mg by mouth as needed.     . Omega-3 Fatty Acids (OMEGA-3 FISH OIL) 1200 MG CAPS Take 2 capsules by mouth daily.     Marland Kitchen TRIMETHOPRIM PO Take 100 mg by mouth as needed.     Marland Kitchen anastrozole (ARIMIDEX) 1 MG tablet Take 1 tablet (1 mg total) by mouth daily. 90 tablet 3   No current facility-administered medications for this visit.    PHYSICAL EXAMINATION: ECOG PERFORMANCE STATUS: 1 - Symptomatic but completely ambulatory  Filed Vitals:   12/04/14 1029  BP: 157/77  Pulse: 71  Temp: 98.5 F (36.9 C)  Resp: 18  Filed Weights   12/04/14 1029  Weight: 155 lb 4.8 oz (70.444 kg)    GENERAL:alert, no distress and comfortable SKIN: skin color, texture, turgor are normal, no rashes or significant lesions EYES: normal, Conjunctiva are pink and non-injected, sclera clear OROPHARYNX:no exudate, no erythema and lips, buccal mucosa, and tongue normal  NECK: supple, thyroid normal size,  non-tender, without nodularity LYMPH:  no palpable lymphadenopathy in the cervical, axillary or inguinal LUNGS: clear to auscultation and percussion with normal breathing effort HEART: regular rate & rhythm and no murmurs and no lower extremity edema ABDOMEN:abdomen soft, non-tender and normal bowel sounds Musculoskeletal:no cyanosis of digits and no clubbing  NEURO: alert & oriented x 3 with fluent speech, no focal motor/sensory deficits  LABORATORY DATA:  I have reviewed the data as listed   Chemistry      Component Value Date/Time   NA 141 09/04/2014 1336   NA 139 06/14/2012 1036   K 4.6 09/04/2014 1336   K 4.2 06/14/2012 1036   CL 105 06/09/2013 1024   CL 104 06/14/2012 1036   CO2 27 09/04/2014 1336   CO2 27 06/14/2012 1036   BUN 15.5 09/04/2014 1336   BUN 13 06/14/2012 1036   CREATININE 0.8 09/04/2014 1336   CREATININE 0.70 06/14/2012 1036      Component Value Date/Time   CALCIUM 10.0 09/04/2014 1336   CALCIUM 9.5 06/14/2012 1036   ALKPHOS 80 09/04/2014 1336   ALKPHOS 69 06/14/2012 1036   AST 21 09/04/2014 1336   AST 21 06/14/2012 1036   ALT 26 09/04/2014 1336   ALT 27 06/14/2012 1036   BILITOT 1.66* 09/04/2014 1336   BILITOT 1.4* 06/14/2012 1036       Lab Results  Component Value Date   WBC 8.9 09/04/2014   HGB 16.4* 09/04/2014   HCT 49.3* 09/04/2014   MCV 89.7 09/04/2014   PLT 224 09/04/2014   NEUTROABS 5.6 09/04/2014     RADIOGRAPHIC STUDIES: I have personally reviewed the radiology reports and agreed with their findings. 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  Aromasin toxicities: No major side effects Aromasin continuing to monitor her for side effects. Surveillance for breast cancer: 1. Breast exam 09/04/2014 normal 2. Mammogram done at [facility] 3. DEXA scan 08/30/2014: Osteopenia T score -1.6: Continue with calcium and vitamin D. Patient's primary care physician has been ordering this DEXA scans. We will follow up on subsequent results. Patient is paying $500 for Aromasin prescription. I discussed that we will switch her to Arimidex. They're all equivalent.  No clinical or radiological evidence of disease relapse.  Return to clinic in 6 months for follow-up.  Polycythemia, secondary Mildly elevated hemoglobin: Patient is asymptomatic. I did not initiate an extensive workup at this time to determine whether it is primary or secondary since the hemoglobin levels are not significantly increased.    No orders of the defined types were placed in this encounter.   The patient has a good understanding of the overall plan. she agrees with it. She will call with any problems that may develop before her next visit here.   Nicholas Lose  K, MD 12/04/2014 11:27 AM

## 2014-12-12 ENCOUNTER — Encounter: Payer: Self-pay | Admitting: Hematology and Oncology

## 2015-03-07 DIAGNOSIS — L578 Other skin changes due to chronic exposure to nonionizing radiation: Secondary | ICD-10-CM | POA: Diagnosis not present

## 2015-03-07 DIAGNOSIS — L821 Other seborrheic keratosis: Secondary | ICD-10-CM | POA: Diagnosis not present

## 2015-03-07 DIAGNOSIS — D2372 Other benign neoplasm of skin of left lower limb, including hip: Secondary | ICD-10-CM | POA: Diagnosis not present

## 2015-03-07 DIAGNOSIS — L718 Other rosacea: Secondary | ICD-10-CM | POA: Diagnosis not present

## 2015-03-07 DIAGNOSIS — L570 Actinic keratosis: Secondary | ICD-10-CM | POA: Diagnosis not present

## 2015-03-07 DIAGNOSIS — D2272 Melanocytic nevi of left lower limb, including hip: Secondary | ICD-10-CM | POA: Diagnosis not present

## 2015-03-07 DIAGNOSIS — D2271 Melanocytic nevi of right lower limb, including hip: Secondary | ICD-10-CM | POA: Diagnosis not present

## 2015-03-07 DIAGNOSIS — D1801 Hemangioma of skin and subcutaneous tissue: Secondary | ICD-10-CM | POA: Diagnosis not present

## 2015-03-07 DIAGNOSIS — L814 Other melanin hyperpigmentation: Secondary | ICD-10-CM | POA: Diagnosis not present

## 2015-03-28 DIAGNOSIS — R350 Frequency of micturition: Secondary | ICD-10-CM | POA: Diagnosis not present

## 2015-03-28 DIAGNOSIS — R3989 Other symptoms and signs involving the genitourinary system: Secondary | ICD-10-CM | POA: Diagnosis not present

## 2015-04-02 ENCOUNTER — Other Ambulatory Visit: Payer: Self-pay | Admitting: *Deleted

## 2015-04-02 DIAGNOSIS — C50311 Malignant neoplasm of lower-inner quadrant of right female breast: Secondary | ICD-10-CM

## 2015-04-02 MED ORDER — ANASTROZOLE 1 MG PO TABS: 1.0000 mg | ORAL_TABLET | Freq: Every day | ORAL | Status: DC

## 2015-06-08 DIAGNOSIS — E119 Type 2 diabetes mellitus without complications: Secondary | ICD-10-CM | POA: Diagnosis not present

## 2015-06-08 DIAGNOSIS — R8299 Other abnormal findings in urine: Secondary | ICD-10-CM | POA: Diagnosis not present

## 2015-06-08 DIAGNOSIS — N390 Urinary tract infection, site not specified: Secondary | ICD-10-CM | POA: Diagnosis not present

## 2015-06-08 DIAGNOSIS — E785 Hyperlipidemia, unspecified: Secondary | ICD-10-CM | POA: Diagnosis not present

## 2015-06-08 DIAGNOSIS — M859 Disorder of bone density and structure, unspecified: Secondary | ICD-10-CM | POA: Diagnosis not present

## 2015-06-13 NOTE — Assessment & Plan Note (Signed)
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  Aromasin toxicities: No major side effects Aromasin continuing to monitor her for side effects. Surveillance for breast cancer: 1. Breast exam 06/14/15 normal 2. Mammogram done at [facility] 3. DEXA scan 08/30/2014: Osteopenia T score -1.6: Continue with calcium and vitamin D. Patient's primary care physician has been ordering this DEXA scans. We will follow up on subsequent results.  Due to cost reasons, switched treatment to Anastrozole  No clinical or radiological evidence of disease relapse.  Return to clinic in 6 months for follow-up.  Polycythemia, secondary Mildly elevated hemoglobin: Patient is asymptomatic. I did not initiate an extensive workup at this time to determine whether it is primary or secondary since the hemoglobin levels are not significantly increased.

## 2015-06-14 ENCOUNTER — Encounter: Payer: Self-pay | Admitting: Hematology and Oncology

## 2015-06-14 ENCOUNTER — Ambulatory Visit (HOSPITAL_BASED_OUTPATIENT_CLINIC_OR_DEPARTMENT_OTHER): Payer: Medicare Other | Admitting: Hematology and Oncology

## 2015-06-14 ENCOUNTER — Telehealth: Payer: Self-pay | Admitting: Hematology and Oncology

## 2015-06-14 VITALS — BP 174/88 | HR 91 | Temp 98.0°F | Resp 18 | Ht 64.0 in | Wt 155.3 lb

## 2015-06-14 DIAGNOSIS — D0511 Intraductal carcinoma in situ of right breast: Secondary | ICD-10-CM | POA: Diagnosis not present

## 2015-06-14 DIAGNOSIS — D751 Secondary polycythemia: Secondary | ICD-10-CM | POA: Diagnosis not present

## 2015-06-14 DIAGNOSIS — C50311 Malignant neoplasm of lower-inner quadrant of right female breast: Secondary | ICD-10-CM

## 2015-06-14 DIAGNOSIS — M8580 Other specified disorders of bone density and structure, unspecified site: Secondary | ICD-10-CM | POA: Diagnosis not present

## 2015-06-14 NOTE — Telephone Encounter (Signed)
Gave avs & calendar for June 2017 °

## 2015-06-14 NOTE — Progress Notes (Signed)
Patient Care Team: Crist Infante, MD as PCP - General (Internal Medicine) Lorelle Gibbs, MD as Consulting Physician (Radiology) Peri Maris, MD as Consulting Physician (Obstetrics and Gynecology) Crissie Reese, MD as Consulting Physician (Plastic Surgery) Consuela Mimes, MD as Consulting Physician (Internal Medicine)  DIAGNOSIS: Breast cancer of lower-inner quadrant of right female breast   Staging form: Breast, AJCC 7th Edition     Clinical: No stage assigned - Unsigned     Pathologic: Stage IA (T1a, N0, cM0) - Signed by Rulon Eisenmenger, MD on 09/04/2014   SUMMARY OF ONCOLOGIC HISTORY:   Breast cancer of lower-inner quadrant of right female breast   12/31/2010 Initial Diagnosis Invasive ductal carcinoma grade 1 ER 90% PR 96% Ki-67 11% HER-2 negative ratio 1.08   01/30/2011 Surgery Right breast mastectomy: No invasive cancer, high-grade DCIS with comedonecrosis 3 lymph nodes negative: Followed by immediate reconstruction with implant   05/29/2011 -  Anti-estrogen oral therapy Aromasin 25 mg daily, due to cost, changed to Arimidex 1 mg daily 12/04/2014    CHIEF COMPLIANT: Follow-up of breast cancer on anastrozole  INTERVAL HISTORY: Breanna Neal is a 69 year old with above-mentioned history of right breast cancer currently on antiestrogen therapy with the anastrozole. We switched her from Aromasin to anastrozole due to cost reasons. Is tolerating it extremely well without any major problems or concerns. Denies any hot flashes or myalgias. Denies any lumps or nodules in the breasts.  REVIEW OF SYSTEMS:   Constitutional: Denies fevers, chills or abnormal weight loss Eyes: Denies blurriness of vision Ears, nose, mouth, throat, and face: Denies mucositis or sore throat Respiratory: Denies cough, dyspnea or wheezes Cardiovascular: Denies palpitation, chest discomfort or lower extremity swelling Gastrointestinal:  Denies nausea, heartburn or change in bowel habits Skin: Denies abnormal  skin rashes Lymphatics: Denies new lymphadenopathy or easy bruising Neurological:Denies numbness, tingling or new weaknesses Behavioral/Psych: Mood is stable, no new changes  Breast:  denies any pain or lumps or nodules in either breasts All other systems were reviewed with the patient and are negative.  I have reviewed the past medical history, past surgical history, social history and family history with the patient and they are unchanged from previous note.  ALLERGIES:  has No Known Allergies.  MEDICATIONS:  Current Outpatient Prescriptions  Medication Sig Dispense Refill  . anastrozole (ARIMIDEX) 1 MG tablet Take 1 tablet (1 mg total) by mouth daily. 90 tablet 3  . aspirin 81 MG chewable tablet Chew 81 mg by mouth 2 (two) times a week.     Marland Kitchen atorvastatin (LIPITOR) 80 MG tablet Take 40 mg by mouth daily.     . Calcium Carbonate-Vitamin D (CALCIUM 600 + D PO) Take by mouth daily.     . Cholecalciferol (VITAMIN D) 1000 UNITS capsule Take 5,000 Units by mouth. Every other week    . fexofenadine (ALLEGRA) 30 MG tablet Take 30 mg by mouth as needed.     . Omega-3 Fatty Acids (OMEGA-3 FISH OIL) 1200 MG CAPS Take 2 capsules by mouth daily.     Marland Kitchen TRIMETHOPRIM PO Take 100 mg by mouth as needed.      No current facility-administered medications for this visit.    PHYSICAL EXAMINATION: ECOG PERFORMANCE STATUS: 0 - Asymptomatic  Filed Vitals:   06/14/15 0942  BP: 174/88  Pulse: 91  Temp: 98 F (36.7 C)  Resp: 18   Filed Weights   06/14/15 0942  Weight: 155 lb 4.8 oz (70.444 kg)    GENERAL:alert, no  distress and comfortable SKIN: skin color, texture, turgor are normal, no rashes or significant lesions EYES: normal, Conjunctiva are pink and non-injected, sclera clear OROPHARYNX:no exudate, no erythema and lips, buccal mucosa, and tongue normal  NECK: supple, thyroid normal size, non-tender, without nodularity LYMPH:  no palpable lymphadenopathy in the cervical, axillary or  inguinal LUNGS: clear to auscultation and percussion with normal breathing effort HEART: regular rate & rhythm and no murmurs and no lower extremity edema ABDOMEN:abdomen soft, non-tender and normal bowel sounds Musculoskeletal:no cyanosis of digits and no clubbing  NEURO: alert & oriented x 3 with fluent speech, no focal motor/sensory deficits BREAST: No palpable masses or nodules in either right or left breasts. No palpable axillary supraclavicular or infraclavicular adenopathy no breast tenderness or nipple discharge. (exam performed in the presence of a chaperone)  LABORATORY DATA:  I have reviewed the data as listed   Chemistry      Component Value Date/Time   NA 141 09/04/2014 1336   NA 139 06/14/2012 1036   K 4.6 09/04/2014 1336   K 4.2 06/14/2012 1036   CL 105 06/09/2013 1024   CL 104 06/14/2012 1036   CO2 27 09/04/2014 1336   CO2 27 06/14/2012 1036   BUN 15.5 09/04/2014 1336   BUN 13 06/14/2012 1036   CREATININE 0.8 09/04/2014 1336   CREATININE 0.70 06/14/2012 1036      Component Value Date/Time   CALCIUM 10.0 09/04/2014 1336   CALCIUM 9.5 06/14/2012 1036   ALKPHOS 80 09/04/2014 1336   ALKPHOS 69 06/14/2012 1036   AST 21 09/04/2014 1336   AST 21 06/14/2012 1036   ALT 26 09/04/2014 1336   ALT 27 06/14/2012 1036   BILITOT 1.66* 09/04/2014 1336   BILITOT 1.4* 06/14/2012 1036       Lab Results  Component Value Date   WBC 8.9 09/04/2014   HGB 16.4* 09/04/2014   HCT 49.3* 09/04/2014   MCV 89.7 09/04/2014   PLT 224 09/04/2014   NEUTROABS 5.6 09/04/2014     RADIOGRAPHIC STUDIES: I have personally reviewed the radiology reports and agreed with their findings. 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  Anastrozole toxicities: No major side effects Aromasin continuing to monitor her for side effects. Surveillance for breast cancer: 1. Breast exam 06/14/15 normal 2. Mammogram done at [facility] done December 2015 on left breast is normal 3. DEXA scan 08/30/2014: Osteopenia T score -1.6: Continue with calcium and vitamin D. Patient's primary care physician has been ordering this DEXA scans. We will follow up on subsequent results.  Due to cost reasons, switched treatment to Anastrozole. Patient is very happy about this which is she is saving almost $800 every month. She has experienced no side effects from the switch.   I would like to perform breast cancer index however she has [employer] which does not cover this test and hence we will wait for her as she plans to switch her insurance carrier next year and then run the test if it would be covered by her insurance. This will enable [place] to determine if extended adjuvant therapy beyond 5 years is going to be of benefit for her.  No clinical or radiological evidence of  disease relapse.  Return to clinic in 6 months for follow-up.  Polycythemia, secondary Mildly elevated hemoglobin: Patient is asymptomatic.We will obtain blood work from Dr. Joylene Draft.  No orders of the defined types were placed in this encounter.   The patient has a good understanding of the overall plan. she agrees with it. she will call with any problems that may develop before the next visit here.   Rulon Eisenmenger, MD

## 2015-06-15 DIAGNOSIS — R011 Cardiac murmur, unspecified: Secondary | ICD-10-CM | POA: Diagnosis not present

## 2015-06-15 DIAGNOSIS — N318 Other neuromuscular dysfunction of bladder: Secondary | ICD-10-CM | POA: Diagnosis not present

## 2015-06-15 DIAGNOSIS — Z1389 Encounter for screening for other disorder: Secondary | ICD-10-CM | POA: Diagnosis not present

## 2015-06-15 DIAGNOSIS — R0683 Snoring: Secondary | ICD-10-CM | POA: Diagnosis not present

## 2015-06-15 DIAGNOSIS — E785 Hyperlipidemia, unspecified: Secondary | ICD-10-CM | POA: Diagnosis not present

## 2015-06-15 DIAGNOSIS — R030 Elevated blood-pressure reading, without diagnosis of hypertension: Secondary | ICD-10-CM | POA: Diagnosis not present

## 2015-06-15 DIAGNOSIS — Z0000 Encounter for general adult medical examination without abnormal findings: Secondary | ICD-10-CM | POA: Diagnosis not present

## 2015-06-15 DIAGNOSIS — N952 Postmenopausal atrophic vaginitis: Secondary | ICD-10-CM | POA: Diagnosis not present

## 2015-06-15 DIAGNOSIS — C50919 Malignant neoplasm of unspecified site of unspecified female breast: Secondary | ICD-10-CM | POA: Diagnosis not present

## 2015-06-15 DIAGNOSIS — E119 Type 2 diabetes mellitus without complications: Secondary | ICD-10-CM | POA: Diagnosis not present

## 2015-06-15 DIAGNOSIS — Z6826 Body mass index (BMI) 26.0-26.9, adult: Secondary | ICD-10-CM | POA: Diagnosis not present

## 2015-06-18 DIAGNOSIS — Z1212 Encounter for screening for malignant neoplasm of rectum: Secondary | ICD-10-CM | POA: Diagnosis not present

## 2015-06-20 ENCOUNTER — Telehealth: Payer: Self-pay

## 2015-06-20 NOTE — Telephone Encounter (Signed)
Results rcvd from [place] [facility] 06/15/15.   Reviewed by Dr. Lindi Adie.  Sent to scan.

## 2015-07-18 DIAGNOSIS — R030 Elevated blood-pressure reading, without diagnosis of hypertension: Secondary | ICD-10-CM | POA: Diagnosis not present

## 2015-07-18 DIAGNOSIS — Z6827 Body mass index (BMI) 27.0-27.9, adult: Secondary | ICD-10-CM | POA: Diagnosis not present

## 2015-07-19 DIAGNOSIS — R030 Elevated blood-pressure reading, without diagnosis of hypertension: Secondary | ICD-10-CM | POA: Diagnosis not present

## 2015-08-06 DIAGNOSIS — E119 Type 2 diabetes mellitus without complications: Secondary | ICD-10-CM | POA: Diagnosis not present

## 2015-09-22 DIAGNOSIS — Z23 Encounter for immunization: Secondary | ICD-10-CM | POA: Diagnosis not present

## 2015-10-31 ENCOUNTER — Telehealth: Payer: Self-pay | Admitting: *Deleted

## 2015-10-31 NOTE — Telephone Encounter (Signed)
Patient called.  Dr. Lindi Adie asked her to check to see if her insurance would cover the BCI - Breast Cancer Index.  Patient states she needs a code for this.  Our coding staff did not have the information.  Called Pathology and the person who knows this, Claiborne Billings, is gone for today but she will return tomorrow and  I asked that she give Dr. Geralyn Flash nurse, Karna Christmas, a call to let her know what the code is for this patient's search.  Spoke with patient and she does have primary - medicare.  The Breast Cancer Index website states that medicare will cover the cost of this.  Patient also saw this on website.   Will ask Dr. Geralyn Flash nurse, Terri to call patient with info when she hears from Pathology.

## 2015-11-01 ENCOUNTER — Telehealth: Payer: Self-pay | Admitting: *Deleted

## 2015-11-01 NOTE — Telephone Encounter (Signed)
Called patient and let her know that Code for Breast Cancer Index is 81479.  She appreciated the information and will check with Medicare.

## 2015-11-30 DIAGNOSIS — Z853 Personal history of malignant neoplasm of breast: Secondary | ICD-10-CM | POA: Diagnosis not present

## 2015-11-30 DIAGNOSIS — Z1231 Encounter for screening mammogram for malignant neoplasm of breast: Secondary | ICD-10-CM | POA: Diagnosis not present

## 2015-12-13 ENCOUNTER — Encounter: Payer: Self-pay | Admitting: *Deleted

## 2015-12-13 NOTE — Progress Notes (Signed)
Received mammo report from [facility], reviewed by Dr. Lindi Adie, sent to scan.

## 2016-01-15 DIAGNOSIS — M859 Disorder of bone density and structure, unspecified: Secondary | ICD-10-CM | POA: Diagnosis not present

## 2016-01-16 ENCOUNTER — Other Ambulatory Visit: Payer: Self-pay

## 2016-01-16 DIAGNOSIS — C50311 Malignant neoplasm of lower-inner quadrant of right female breast: Secondary | ICD-10-CM

## 2016-01-16 MED ORDER — ANASTROZOLE 1 MG PO TABS: 1.0000 mg | ORAL_TABLET | Freq: Every day | ORAL | Status: DC

## 2016-04-07 DIAGNOSIS — M5416 Radiculopathy, lumbar region: Secondary | ICD-10-CM | POA: Diagnosis not present

## 2016-04-07 DIAGNOSIS — Z6827 Body mass index (BMI) 27.0-27.9, adult: Secondary | ICD-10-CM | POA: Diagnosis not present

## 2016-04-16 DIAGNOSIS — M25551 Pain in right hip: Secondary | ICD-10-CM | POA: Diagnosis not present

## 2016-04-16 DIAGNOSIS — M545 Low back pain: Secondary | ICD-10-CM | POA: Diagnosis not present

## 2016-04-18 DIAGNOSIS — M542 Cervicalgia: Secondary | ICD-10-CM | POA: Diagnosis not present

## 2016-04-21 DIAGNOSIS — M545 Low back pain: Secondary | ICD-10-CM | POA: Diagnosis not present

## 2016-04-24 DIAGNOSIS — M545 Low back pain: Secondary | ICD-10-CM | POA: Diagnosis not present

## 2016-04-28 DIAGNOSIS — M545 Low back pain: Secondary | ICD-10-CM | POA: Diagnosis not present

## 2016-04-30 DIAGNOSIS — R350 Frequency of micturition: Secondary | ICD-10-CM | POA: Diagnosis not present

## 2016-04-30 DIAGNOSIS — R3989 Other symptoms and signs involving the genitourinary system: Secondary | ICD-10-CM | POA: Diagnosis not present

## 2016-04-30 DIAGNOSIS — Z0000 Encounter for general adult medical examination without abnormal findings: Secondary | ICD-10-CM | POA: Diagnosis not present

## 2016-05-01 DIAGNOSIS — M545 Low back pain: Secondary | ICD-10-CM | POA: Diagnosis not present

## 2016-05-05 DIAGNOSIS — M545 Low back pain: Secondary | ICD-10-CM | POA: Diagnosis not present

## 2016-05-08 DIAGNOSIS — D2261 Melanocytic nevi of right upper limb, including shoulder: Secondary | ICD-10-CM | POA: Diagnosis not present

## 2016-05-08 DIAGNOSIS — M545 Low back pain: Secondary | ICD-10-CM | POA: Diagnosis not present

## 2016-05-08 DIAGNOSIS — L814 Other melanin hyperpigmentation: Secondary | ICD-10-CM | POA: Diagnosis not present

## 2016-05-08 DIAGNOSIS — D2272 Melanocytic nevi of left lower limb, including hip: Secondary | ICD-10-CM | POA: Diagnosis not present

## 2016-05-08 DIAGNOSIS — D2271 Melanocytic nevi of right lower limb, including hip: Secondary | ICD-10-CM | POA: Diagnosis not present

## 2016-05-08 DIAGNOSIS — D2262 Melanocytic nevi of left upper limb, including shoulder: Secondary | ICD-10-CM | POA: Diagnosis not present

## 2016-05-08 DIAGNOSIS — L821 Other seborrheic keratosis: Secondary | ICD-10-CM | POA: Diagnosis not present

## 2016-05-08 DIAGNOSIS — B078 Other viral warts: Secondary | ICD-10-CM | POA: Diagnosis not present

## 2016-05-08 DIAGNOSIS — L720 Epidermal cyst: Secondary | ICD-10-CM | POA: Diagnosis not present

## 2016-05-08 DIAGNOSIS — D1801 Hemangioma of skin and subcutaneous tissue: Secondary | ICD-10-CM | POA: Diagnosis not present

## 2016-05-08 DIAGNOSIS — D225 Melanocytic nevi of trunk: Secondary | ICD-10-CM | POA: Diagnosis not present

## 2016-05-12 DIAGNOSIS — M545 Low back pain: Secondary | ICD-10-CM | POA: Diagnosis not present

## 2016-05-15 DIAGNOSIS — M545 Low back pain: Secondary | ICD-10-CM | POA: Diagnosis not present

## 2016-05-17 DIAGNOSIS — M545 Low back pain: Secondary | ICD-10-CM | POA: Diagnosis not present

## 2016-05-20 DIAGNOSIS — M545 Low back pain: Secondary | ICD-10-CM | POA: Diagnosis not present

## 2016-05-21 DIAGNOSIS — M545 Low back pain: Secondary | ICD-10-CM | POA: Diagnosis not present

## 2016-06-02 DIAGNOSIS — M5416 Radiculopathy, lumbar region: Secondary | ICD-10-CM | POA: Diagnosis not present

## 2016-06-02 DIAGNOSIS — M549 Dorsalgia, unspecified: Secondary | ICD-10-CM | POA: Diagnosis not present

## 2016-06-02 DIAGNOSIS — Z6827 Body mass index (BMI) 27.0-27.9, adult: Secondary | ICD-10-CM | POA: Diagnosis not present

## 2016-06-02 DIAGNOSIS — I10 Essential (primary) hypertension: Secondary | ICD-10-CM | POA: Diagnosis not present

## 2016-06-05 DIAGNOSIS — M5416 Radiculopathy, lumbar region: Secondary | ICD-10-CM | POA: Diagnosis not present

## 2016-06-06 DIAGNOSIS — M545 Low back pain: Secondary | ICD-10-CM | POA: Diagnosis not present

## 2016-06-06 DIAGNOSIS — M5416 Radiculopathy, lumbar region: Secondary | ICD-10-CM | POA: Diagnosis not present

## 2016-06-12 ENCOUNTER — Telehealth: Payer: Self-pay | Admitting: Hematology and Oncology

## 2016-06-12 ENCOUNTER — Ambulatory Visit (HOSPITAL_BASED_OUTPATIENT_CLINIC_OR_DEPARTMENT_OTHER): Payer: Medicare Other | Admitting: Hematology and Oncology

## 2016-06-12 ENCOUNTER — Encounter: Payer: Self-pay | Admitting: Hematology and Oncology

## 2016-06-12 VITALS — BP 151/69 | HR 74 | Temp 98.3°F | Resp 18 | Wt 158.9 lb

## 2016-06-12 DIAGNOSIS — M8580 Other specified disorders of bone density and structure, unspecified site: Secondary | ICD-10-CM | POA: Diagnosis not present

## 2016-06-12 DIAGNOSIS — C50311 Malignant neoplasm of lower-inner quadrant of right female breast: Secondary | ICD-10-CM | POA: Diagnosis not present

## 2016-06-12 DIAGNOSIS — D3610 Benign neoplasm of peripheral nerves and autonomic nervous system, unspecified: Secondary | ICD-10-CM | POA: Diagnosis not present

## 2016-06-12 MED ORDER — VALSARTAN 80 MG PO TABS: 80.0000 mg | ORAL_TABLET | Freq: Every day | ORAL | Status: DC

## 2016-06-12 NOTE — Assessment & Plan Note (Signed)
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  Anastrozole toxicities: No major side effects Aromasin continuing to monitor her for side effects. Surveillance for breast cancer: 1. Breast exam 06/12/2016 normal 2. Mammogram done at [facility] done December 2016 on left breast is normal 3. DEXA scan 08/30/2014: Osteopenia T score -1.6: Continue with calcium and vitamin D. Patient's primary care physician has been ordering this DEXA scans. We will follow up on subsequent results.  Due to cost reasons, switched treatment to Anastrozole. Patient is very happy about this which is she is saving almost $800 every month. She has experienced no side effects from the switch.   I would like to perform breast cancer index however she has [employer] which does not cover this test and hence we will wait for her as she plans to switch her insurance carrier next year and then run the test if it would be covered by her insurance. This will enable [place] to determine if extended adjuvant therapy beyond 5 years is going to be of benefit for her.  No clinical or radiological evidence of disease relapse.  Return to clinic in 1 year for follow-up.

## 2016-06-12 NOTE — Progress Notes (Signed)
Patient Care Team: Crist Infante, MD as PCP - General (Internal Medicine) Lorelle Gibbs, MD as Consulting Physician (Radiology) Peri Maris, MD as Consulting Physician (Obstetrics and Gynecology) Crissie Reese, MD as Consulting Physician (Plastic Surgery) Consuela Mimes, MD as Consulting Physician (Internal Medicine)  DIAGNOSIS: Breast cancer of lower-inner quadrant of right female breast [facility])   Staging form: Breast, AJCC 7th Edition     Clinical: No stage assigned - Unsigned     Pathologic: Stage IA (T1a, N0, cM0) - Signed by Rulon Eisenmenger, MD on 09/04/2014   SUMMARY OF ONCOLOGIC HISTORY:   Breast cancer of lower-inner quadrant of right female breast ([place])   12/31/2010 Initial Diagnosis Invasive ductal carcinoma grade 1 ER 90% PR 96% Ki-67 11% HER-2 negative ratio 1.08   01/30/2011 Surgery Right breast mastectomy: No invasive cancer, high-grade DCIS with comedonecrosis 3 lymph nodes negative: Followed by immediate reconstruction with implant   05/29/2011 -  Anti-estrogen oral therapy Aromasin 25 mg daily, due to cost, changed to Arimidex 1 mg daily 12/04/2014    CHIEF COMPLIANT: Follow-up on anastrozole  INTERVAL HISTORY: Breanna Neal is a 70 year old with abnormal measurements a right breast cancer treated with mastectomy and is currently on antiestrogen therapy with anastrozole. She appears to be tolerating it extremely well. She feels moderately warm but otherwise denies any hot flashes or myalgias. Denies any lumps or nodules. She was recently diagnosed with neurofibroma of the spine which led to a numbness around the waist area.  REVIEW OF SYSTEMS:   Constitutional: Denies fevers, chills or abnormal weight loss Eyes: Denies blurriness of vision Ears, nose, mouth, throat, and face: Denies mucositis or sore throat Respiratory: Denies cough, dyspnea or wheezes Cardiovascular: Denies palpitation, chest discomfort Gastrointestinal:  Denies nausea, heartburn or change in  bowel habits Skin: Denies abnormal skin rashes Lymphatics: Denies new lymphadenopathy or easy bruising Neurological:Denies numbness, tingling or new weaknesses Behavioral/Psych: Mood is stable, no new changes  Extremities: No lower extremity edema Breast:  denies any pain or lumps or nodules in either breasts All other systems were reviewed with the patient and are negative.  I have reviewed the past medical history, past surgical history, social history and family history with the patient and they are unchanged from previous note.  ALLERGIES:  has No Known Allergies.  MEDICATIONS:  Current Outpatient Prescriptions  Medication Sig Dispense Refill  . anastrozole (ARIMIDEX) 1 MG tablet Take 1 tablet (1 mg total) by mouth daily. 90 tablet 3  . aspirin 81 MG chewable tablet Chew 81 mg by mouth 2 (two) times a week.     Marland Kitchen atorvastatin (LIPITOR) 80 MG tablet Take 40 mg by mouth daily.     . Calcium Carbonate-Vitamin D (CALCIUM 600 + D PO) Take by mouth daily.     . Cholecalciferol (VITAMIN D) 1000 UNITS capsule Take 5,000 Units by mouth. Every other week    . fexofenadine (ALLEGRA) 30 MG tablet Take 30 mg by mouth as needed.     . Omega-3 Fatty Acids (OMEGA-3 FISH OIL) 1200 MG CAPS Take 2 capsules by mouth daily.     Marland Kitchen TRIMETHOPRIM PO Take 100 mg by mouth as needed.      No current facility-administered medications for this visit.    PHYSICAL EXAMINATION: ECOG PERFORMANCE STATUS: 1 - Symptomatic but completely ambulatory  Filed Vitals:   06/12/16 0852  BP: 151/69  Pulse: 74  Temp: 98.3 F (36.8 C)  Resp: 18   Filed Weights  06/12/16 0852  Weight: 158 lb 14.4 oz (72.077 kg)    GENERAL:alert, no distress and comfortable SKIN: skin color, texture, turgor are normal, no rashes or significant lesions EYES: normal, Conjunctiva are pink and non-injected, sclera clear OROPHARYNX:no exudate, no erythema and lips, buccal mucosa, and tongue normal  NECK: supple, thyroid normal size,  non-tender, without nodularity LYMPH:  no palpable lymphadenopathy in the cervical, axillary or inguinal LUNGS: clear to auscultation and percussion with normal breathing effort HEART: regular rate & rhythm and no murmurs and no lower extremity edema ABDOMEN:abdomen soft, non-tender and normal bowel sounds MUSCULOSKELETAL:no cyanosis of digits and no clubbing  NEURO: alert & oriented x 3 with fluent speech, no focal motor/sensory deficits EXTREMITIES: No lower extremity edema BREAST: No palpable masses or nodules.No palpable axillary supraclavicular or infraclavicular adenopathy no breast tenderness or nipple discharge. (exam performed in the presence of a chaperone)  LABORATORY DATA:  I have reviewed the data as listed   Chemistry      Component Value Date/Time   NA 141 09/04/2014 1336   NA 139 06/14/2012 1036   K 4.6 09/04/2014 1336   K 4.2 06/14/2012 1036   CL 105 06/09/2013 1024   CL 104 06/14/2012 1036   CO2 27 09/04/2014 1336   CO2 27 06/14/2012 1036   BUN 15.5 09/04/2014 1336   BUN 13 06/14/2012 1036   CREATININE 0.8 09/04/2014 1336   CREATININE 0.70 06/14/2012 1036      Component Value Date/Time   CALCIUM 10.0 09/04/2014 1336   CALCIUM 9.5 06/14/2012 1036   ALKPHOS 80 09/04/2014 1336   ALKPHOS 69 06/14/2012 1036   AST 21 09/04/2014 1336   AST 21 06/14/2012 1036   ALT 26 09/04/2014 1336   ALT 27 06/14/2012 1036   BILITOT 1.66* 09/04/2014 1336   BILITOT 1.4* 06/14/2012 1036       Lab Results  Component Value Date   WBC 8.9 09/04/2014   HGB 16.4* 09/04/2014   HCT 49.3* 09/04/2014   MCV 89.7 09/04/2014   PLT 224 09/04/2014   NEUTROABS 5.6 09/04/2014     ASSESSMENT & PLAN:  Breast cancer of lower-inner quadrant of right female breast Stage I invasive ductal carcinoma with DCIS right breast: Initial biopsy showed invasive ductal carcinoma of the final mastectomy only showed DCIS ER/PR positive HER-2 negative: Switched from Aromasin to anastrozole December  2015.  Anastrozole toxicities: No major side effects Aromasin continuing to monitor her for side effects. Surveillance for breast cancer: 1. Breast exam 06/12/2016 normal 2. Mammogram done at [facility] done December 2016 on left breast is normal 3. DEXA scan 08/30/2014: Osteopenia T score -1.6: Continue with calcium and vitamin D. Patient's primary care physician has been ordering this DEXA scans. We will follow up on subsequent results.  Due to cost reasons, switched treatment to Anastrozole. Patient is very happy about this which is she is saving almost $800 every month. She has experienced no side effects from the switch.   We discussed extended adjuvant therapy and patient is willing to stay on the medicine for 10 years. So we will continue it for another 5 more years. If she decides to come off of it, at that time we can consider doing breast cancer index.  Neurofibroma: Recently diagnosed and is being followed by [facility] neurosurgery Patient has a small indentation in the medial aspect of the right breast. She would like to get a second opinion from Dr. Synetta Fail. I will send a referral No clinical or radiological  evidence of disease relapse.  Return to clinic in 1 year for follow-up.   No orders of the defined types were placed in this encounter.   The patient has a good understanding of the overall plan. she agrees with it. she will call with any problems that may develop before the next visit here.   Rulon Eisenmenger, MD 06/12/2016

## 2016-06-12 NOTE — Telephone Encounter (Signed)
appt made and avs printed °

## 2016-06-17 DIAGNOSIS — M859 Disorder of bone density and structure, unspecified: Secondary | ICD-10-CM | POA: Diagnosis not present

## 2016-06-17 DIAGNOSIS — E119 Type 2 diabetes mellitus without complications: Secondary | ICD-10-CM | POA: Diagnosis not present

## 2016-06-17 DIAGNOSIS — E784 Other hyperlipidemia: Secondary | ICD-10-CM | POA: Diagnosis not present

## 2016-06-26 DIAGNOSIS — M5416 Radiculopathy, lumbar region: Secondary | ICD-10-CM | POA: Diagnosis not present

## 2016-06-26 DIAGNOSIS — Z1389 Encounter for screening for other disorder: Secondary | ICD-10-CM | POA: Diagnosis not present

## 2016-06-26 DIAGNOSIS — Z6827 Body mass index (BMI) 27.0-27.9, adult: Secondary | ICD-10-CM | POA: Diagnosis not present

## 2016-06-26 DIAGNOSIS — Z23 Encounter for immunization: Secondary | ICD-10-CM | POA: Diagnosis not present

## 2016-06-26 DIAGNOSIS — Z0000 Encounter for general adult medical examination without abnormal findings: Secondary | ICD-10-CM | POA: Diagnosis not present

## 2016-06-26 DIAGNOSIS — R011 Cardiac murmur, unspecified: Secondary | ICD-10-CM | POA: Diagnosis not present

## 2016-06-26 DIAGNOSIS — C50919 Malignant neoplasm of unspecified site of unspecified female breast: Secondary | ICD-10-CM | POA: Diagnosis not present

## 2016-06-26 DIAGNOSIS — E119 Type 2 diabetes mellitus without complications: Secondary | ICD-10-CM | POA: Diagnosis not present

## 2016-06-26 DIAGNOSIS — M859 Disorder of bone density and structure, unspecified: Secondary | ICD-10-CM | POA: Diagnosis not present

## 2016-06-26 DIAGNOSIS — E784 Other hyperlipidemia: Secondary | ICD-10-CM | POA: Diagnosis not present

## 2016-06-26 DIAGNOSIS — Z1231 Encounter for screening mammogram for malignant neoplasm of breast: Secondary | ICD-10-CM | POA: Diagnosis not present

## 2016-06-26 DIAGNOSIS — R030 Elevated blood-pressure reading, without diagnosis of hypertension: Secondary | ICD-10-CM | POA: Diagnosis not present

## 2016-06-27 DIAGNOSIS — Z1212 Encounter for screening for malignant neoplasm of rectum: Secondary | ICD-10-CM | POA: Diagnosis not present

## 2016-07-04 DIAGNOSIS — Z6827 Body mass index (BMI) 27.0-27.9, adult: Secondary | ICD-10-CM | POA: Diagnosis not present

## 2016-07-04 DIAGNOSIS — M5416 Radiculopathy, lumbar region: Secondary | ICD-10-CM | POA: Diagnosis not present

## 2016-07-04 DIAGNOSIS — I10 Essential (primary) hypertension: Secondary | ICD-10-CM | POA: Diagnosis not present

## 2016-08-04 DIAGNOSIS — K760 Fatty (change of) liver, not elsewhere classified: Secondary | ICD-10-CM | POA: Diagnosis not present

## 2016-08-07 DIAGNOSIS — Z23 Encounter for immunization: Secondary | ICD-10-CM | POA: Diagnosis not present

## 2016-08-29 DIAGNOSIS — Z961 Presence of intraocular lens: Secondary | ICD-10-CM | POA: Diagnosis not present

## 2016-10-01 DIAGNOSIS — H25812 Combined forms of age-related cataract, left eye: Secondary | ICD-10-CM | POA: Diagnosis not present

## 2016-10-01 DIAGNOSIS — H2512 Age-related nuclear cataract, left eye: Secondary | ICD-10-CM | POA: Diagnosis not present

## 2016-12-04 DIAGNOSIS — Z1231 Encounter for screening mammogram for malignant neoplasm of breast: Secondary | ICD-10-CM | POA: Diagnosis not present

## 2016-12-04 DIAGNOSIS — Z853 Personal history of malignant neoplasm of breast: Secondary | ICD-10-CM | POA: Diagnosis not present

## 2017-01-19 ENCOUNTER — Other Ambulatory Visit: Payer: Self-pay | Admitting: Hematology and Oncology

## 2017-01-19 DIAGNOSIS — C50311 Malignant neoplasm of lower-inner quadrant of right female breast: Secondary | ICD-10-CM

## 2017-02-12 DIAGNOSIS — E119 Type 2 diabetes mellitus without complications: Secondary | ICD-10-CM | POA: Diagnosis not present

## 2017-05-05 DIAGNOSIS — Z961 Presence of intraocular lens: Secondary | ICD-10-CM | POA: Diagnosis not present

## 2017-05-28 DIAGNOSIS — L814 Other melanin hyperpigmentation: Secondary | ICD-10-CM | POA: Diagnosis not present

## 2017-05-28 DIAGNOSIS — L570 Actinic keratosis: Secondary | ICD-10-CM | POA: Diagnosis not present

## 2017-05-28 DIAGNOSIS — C44311 Basal cell carcinoma of skin of nose: Secondary | ICD-10-CM | POA: Diagnosis not present

## 2017-05-28 DIAGNOSIS — D1801 Hemangioma of skin and subcutaneous tissue: Secondary | ICD-10-CM | POA: Diagnosis not present

## 2017-05-28 DIAGNOSIS — D225 Melanocytic nevi of trunk: Secondary | ICD-10-CM | POA: Diagnosis not present

## 2017-05-28 DIAGNOSIS — L821 Other seborrheic keratosis: Secondary | ICD-10-CM | POA: Diagnosis not present

## 2017-05-28 DIAGNOSIS — L820 Inflamed seborrheic keratosis: Secondary | ICD-10-CM | POA: Diagnosis not present

## 2017-06-09 ENCOUNTER — Telehealth: Payer: Self-pay | Admitting: Hematology and Oncology

## 2017-06-09 NOTE — Telephone Encounter (Signed)
Left message on VM and a call back umber about appointment changes

## 2017-06-12 ENCOUNTER — Ambulatory Visit: Payer: BLUE CROSS/BLUE SHIELD | Admitting: Hematology and Oncology

## 2017-06-18 DIAGNOSIS — Z85828 Personal history of other malignant neoplasm of skin: Secondary | ICD-10-CM | POA: Diagnosis not present

## 2017-06-18 DIAGNOSIS — C44311 Basal cell carcinoma of skin of nose: Secondary | ICD-10-CM | POA: Diagnosis not present

## 2017-06-25 ENCOUNTER — Ambulatory Visit (HOSPITAL_BASED_OUTPATIENT_CLINIC_OR_DEPARTMENT_OTHER): Payer: Medicare Other | Admitting: Hematology and Oncology

## 2017-06-25 ENCOUNTER — Encounter: Payer: Self-pay | Admitting: Hematology and Oncology

## 2017-06-25 DIAGNOSIS — Z170 Estrogen receptor positive status [ER+]: Secondary | ICD-10-CM | POA: Diagnosis not present

## 2017-06-25 DIAGNOSIS — D3610 Benign neoplasm of peripheral nerves and autonomic nervous system, unspecified: Secondary | ICD-10-CM

## 2017-06-25 DIAGNOSIS — C50311 Malignant neoplasm of lower-inner quadrant of right female breast: Secondary | ICD-10-CM | POA: Diagnosis not present

## 2017-06-25 NOTE — Progress Notes (Signed)
Patient Care Team: Crist Infante, MD as PCP - General (Internal Medicine) Lorelle Gibbs, MD as Consulting Physician (Radiology) Romine, Lubertha South, MD as Consulting Physician (Obstetrics and Gynecology) Crissie Reese, MD as Consulting Physician (Plastic Surgery) Marcy Panning, MD as Consulting Physician (Internal Medicine)  DIAGNOSIS:  Encounter Diagnosis  Name Primary?  . Malignant neoplasm of lower-inner quadrant of right breast of female, estrogen receptor positive ([place])     SUMMARY OF ONCOLOGIC HISTORY:   Breast cancer of lower-inner quadrant of right female breast ([place])   12/31/2010 Initial Diagnosis    Invasive ductal carcinoma grade 1 ER 90% PR 96% Ki-67 11% HER-2 negative ratio 1.08      01/30/2011 Surgery    Right breast mastectomy: No invasive cancer, high-grade DCIS with comedonecrosis 3 lymph nodes negative: Followed by immediate reconstruction with implant      05/29/2011 -  Anti-estrogen oral therapy    Aromasin 25 mg daily, due to cost, changed to Arimidex 1 mg daily 12/04/2014       CHIEF COMPLIANT: Follow-up on anastrozole therapy  INTERVAL HISTORY: LOWEN MANSOURI is a 71 year old with above-mentioned is right breast cancer treated with mastectomy and is now on antiestrogen therapy with Arimidex. She is tolerating it extremely well. She is enjoying her life by going to the beach and staying active with her garden. Denies any lumps or nodules in the breast. December mammograms were normal. She has not had a bone density test since 2015.  REVIEW OF SYSTEMS:   Constitutional: Denies fevers, chills or abnormal weight loss Eyes: Denies blurriness of vision Ears, nose, mouth, throat, and face: Denies mucositis or sore throat Respiratory: Denies cough, dyspnea or wheezes Cardiovascular: Denies palpitation, chest discomfort Gastrointestinal:  Denies nausea, heartburn or change in bowel habits Skin: Denies abnormal skin rashes Lymphatics: Denies new  lymphadenopathy or easy bruising Neurological:Denies numbness, tingling or new weaknesses Behavioral/Psych: Mood is stable, no new changes  Extremities: No lower extremity edema Breast:  denies any pain or lumps or nodules in either breasts All other systems were reviewed with the patient and are negative.  I have reviewed the past medical history, past surgical history, social history and family history with the patient and they are unchanged from previous note.  ALLERGIES:  has No Known Allergies.  MEDICATIONS:  Current Outpatient Prescriptions  Medication Sig Dispense Refill  . anastrozole (ARIMIDEX) 1 MG tablet TAKE 1 TABLET (1 MG TOTAL) BY MOUTH DAILY. 90 tablet 3  . aspirin 81 MG chewable tablet Chew 81 mg by mouth 2 (two) times a week.     Marland Kitchen atorvastatin (LIPITOR) 80 MG tablet Take 40 mg by mouth daily.     . Calcium Carbonate-Vitamin D (CALCIUM 600 + D PO) Take by mouth daily.     . Cholecalciferol (VITAMIN D) 1000 UNITS capsule Take 5,000 Units by mouth. Every other week    . fexofenadine (ALLEGRA) 30 MG tablet Take 30 mg by mouth as needed.     . Omega-3 Fatty Acids (OMEGA-3 FISH OIL) 1200 MG CAPS Take 2 capsules by mouth daily.     Marland Kitchen TRIMETHOPRIM PO Take 100 mg by mouth as needed.     . valsartan (DIOVAN) 80 MG tablet Take 1 tablet (80 mg total) by mouth daily.     No current facility-administered medications for this visit.     PHYSICAL EXAMINATION: ECOG PERFORMANCE STATUS: 0 - Asymptomatic  Vitals:   06/25/17 0914  BP: (!) 175/73  Pulse: 73  Resp: 18  Temp: 98.2 F (36.8 C)   Filed Weights   06/25/17 0914  Weight: 159 lb 3.2 oz (72.2 kg)    GENERAL:alert, no distress and comfortable SKIN: skin color, texture, turgor are normal, no rashes or significant lesions EYES: normal, Conjunctiva are pink and non-injected, sclera clear OROPHARYNX:no exudate, no erythema and lips, buccal mucosa, and tongue normal  NECK: supple, thyroid normal size, non-tender, without  nodularity LYMPH:  no palpable lymphadenopathy in the cervical, axillary or inguinal LUNGS: clear to auscultation and percussion with normal breathing effort HEART: regular rate & rhythm and no murmurs and no lower extremity edema ABDOMEN:abdomen soft, non-tender and normal bowel sounds MUSCULOSKELETAL:no cyanosis of digits and no clubbing  NEURO: alert & oriented x 3 with fluent speech, no focal motor/sensory deficits EXTREMITIES: No lower extremity edema BREAST: No palpable masses or nodules in either right or left breasts. No palpable axillary supraclavicular or infraclavicular adenopathy no breast tenderness or nipple discharge. (exam performed in the presence of a chaperone)  LABORATORY DATA:  I have reviewed the data as listed   Chemistry      Component Value Date/Time   NA 141 09/04/2014 1336   K 4.6 09/04/2014 1336   CL 105 06/09/2013 1024   CO2 27 09/04/2014 1336   BUN 15.5 09/04/2014 1336   CREATININE 0.8 09/04/2014 1336      Component Value Date/Time   CALCIUM 10.0 09/04/2014 1336   ALKPHOS 80 09/04/2014 1336   AST 21 09/04/2014 1336   ALT 26 09/04/2014 1336   BILITOT 1.66 (H) 09/04/2014 1336       Lab Results  Component Value Date   WBC 8.9 09/04/2014   HGB 16.4 (H) 09/04/2014   HCT 49.3 (H) 09/04/2014   MCV 89.7 09/04/2014   PLT 224 09/04/2014   NEUTROABS 5.6 09/04/2014    ASSESSMENT & PLAN:  Breast cancer of lower-inner quadrant of right female breast Stage I invasive ductal carcinoma with DCIS right breast: Initial biopsy showed invasive ductal carcinoma of the final mastectomy only showed DCIS ER/PR positive HER-2 negative: Switched from Aromasin to anastrozole December 2015.  Anastrozole toxicities: No major side effects Aromasin continuing to monitor her for side effects. Surveillance for breast cancer: 1. Breast exam 06/25/2017 normal 2. Mammogram done at [facility] done December 2017 on left breast is normal 3. DEXA scan 08/30/2014: Osteopenia T score  -1.6: Continue with calcium and vitamin D. Patient's primary care physician has been ordering this DEXA scans.  Due to cost reasons, switched treatment to Anastrozole. Patient is very happy about this which is she is saving almost $800 every month. She has experienced no side effects from the switch.   We discussed extended adjuvant therapy. I discussed the recently presented study ABCSG 16 which randomizes patients to 7 years versus 10 years of antiestrogen therapy and found no difference between the 2 arms. She will complete 7 years of therapy by next year. We'll have to make a decision on whether or not she would like to stop her treatment. I instructed her to get a bone density test as well.  Neurofibroma: followed by [facility] neurosurgery  Return to clinic in 1 year for follow-up.   I spent 25 minutes talking to the patient of which more than half was spent in counseling and coordination of care.  No orders of the defined types were placed in this encounter.  The patient has a good understanding of the overall plan. she agrees with it. she will call with any problems  that may develop before the next visit here.   Rulon Eisenmenger, MD 06/25/17

## 2017-06-25 NOTE — Assessment & Plan Note (Signed)
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  Anastrozole toxicities: No major side effects Aromasin continuing to monitor her for side effects. Surveillance for breast cancer: 1. Breast exam 06/25/2017 normal 2. Mammogram done at [facility] done December 2017 on left breast is normal 3. DEXA scan 08/30/2014: Osteopenia T score -1.6: Continue with calcium and vitamin D. Patient's primary care physician has been ordering this DEXA scans.  Due to cost reasons, switched treatment to Anastrozole. Patient is very happy about this which is she is saving almost $800 every month. She has experienced no side effects from the switch.   We discussed extended adjuvant therapy and patient is willing to stay on the medicine for 10 years. So we will continue it for another 5 more years.  Neurofibroma: followed by [facility]  Return to clinic in 1 year for follow-up.

## 2017-07-31 DIAGNOSIS — H0014 Chalazion left upper eyelid: Secondary | ICD-10-CM | POA: Diagnosis not present

## 2017-08-17 DIAGNOSIS — E119 Type 2 diabetes mellitus without complications: Secondary | ICD-10-CM | POA: Diagnosis not present

## 2017-08-17 DIAGNOSIS — E784 Other hyperlipidemia: Secondary | ICD-10-CM | POA: Diagnosis not present

## 2017-08-17 DIAGNOSIS — N390 Urinary tract infection, site not specified: Secondary | ICD-10-CM | POA: Diagnosis not present

## 2017-08-17 DIAGNOSIS — M859 Disorder of bone density and structure, unspecified: Secondary | ICD-10-CM | POA: Diagnosis not present

## 2017-08-17 DIAGNOSIS — R8299 Other abnormal findings in urine: Secondary | ICD-10-CM | POA: Diagnosis not present

## 2017-08-19 ENCOUNTER — Other Ambulatory Visit: Payer: Self-pay | Admitting: Internal Medicine

## 2017-08-19 DIAGNOSIS — Z0000 Encounter for general adult medical examination without abnormal findings: Secondary | ICD-10-CM | POA: Diagnosis not present

## 2017-08-19 DIAGNOSIS — R011 Cardiac murmur, unspecified: Secondary | ICD-10-CM | POA: Diagnosis not present

## 2017-08-19 DIAGNOSIS — R030 Elevated blood-pressure reading, without diagnosis of hypertension: Secondary | ICD-10-CM | POA: Diagnosis not present

## 2017-08-19 DIAGNOSIS — Z23 Encounter for immunization: Secondary | ICD-10-CM | POA: Diagnosis not present

## 2017-08-19 DIAGNOSIS — C50919 Malignant neoplasm of unspecified site of unspecified female breast: Secondary | ICD-10-CM | POA: Diagnosis not present

## 2017-08-19 DIAGNOSIS — Z6826 Body mass index (BMI) 26.0-26.9, adult: Secondary | ICD-10-CM | POA: Diagnosis not present

## 2017-08-19 DIAGNOSIS — E119 Type 2 diabetes mellitus without complications: Secondary | ICD-10-CM | POA: Diagnosis not present

## 2017-08-19 DIAGNOSIS — M5416 Radiculopathy, lumbar region: Secondary | ICD-10-CM | POA: Diagnosis not present

## 2017-08-19 DIAGNOSIS — R197 Diarrhea, unspecified: Secondary | ICD-10-CM | POA: Diagnosis not present

## 2017-08-19 DIAGNOSIS — R0683 Snoring: Secondary | ICD-10-CM | POA: Diagnosis not present

## 2017-08-19 DIAGNOSIS — Z1389 Encounter for screening for other disorder: Secondary | ICD-10-CM | POA: Diagnosis not present

## 2017-08-19 DIAGNOSIS — Z1212 Encounter for screening for malignant neoplasm of rectum: Secondary | ICD-10-CM | POA: Diagnosis not present

## 2017-08-19 DIAGNOSIS — E784 Other hyperlipidemia: Secondary | ICD-10-CM | POA: Diagnosis not present

## 2017-08-21 ENCOUNTER — Other Ambulatory Visit: Payer: Self-pay

## 2017-08-21 ENCOUNTER — Ambulatory Visit (HOSPITAL_COMMUNITY): Payer: Medicare Other | Attending: Cardiovascular Disease

## 2017-08-21 DIAGNOSIS — R011 Cardiac murmur, unspecified: Secondary | ICD-10-CM | POA: Diagnosis not present

## 2017-08-21 DIAGNOSIS — I517 Cardiomegaly: Secondary | ICD-10-CM | POA: Diagnosis not present

## 2017-08-21 DIAGNOSIS — Z853 Personal history of malignant neoplasm of breast: Secondary | ICD-10-CM | POA: Diagnosis not present

## 2017-08-25 ENCOUNTER — Telehealth: Payer: Self-pay | Admitting: Cardiology

## 2017-08-25 NOTE — Telephone Encounter (Signed)
Received records from [facility] for appointment on 08/26/17 with Dr Martinique.  Records put with Dr Doug Sou schedule for 08/26/17. lp

## 2017-08-26 ENCOUNTER — Encounter: Payer: Self-pay | Admitting: Cardiology

## 2017-08-26 ENCOUNTER — Ambulatory Visit (INDEPENDENT_AMBULATORY_CARE_PROVIDER_SITE_OTHER): Payer: Medicare Other | Admitting: Cardiology

## 2017-08-26 VITALS — BP 140/80 | HR 77 | Ht 64.0 in | Wt 154.0 lb

## 2017-08-26 DIAGNOSIS — E782 Mixed hyperlipidemia: Secondary | ICD-10-CM

## 2017-08-26 DIAGNOSIS — I119 Hypertensive heart disease without heart failure: Secondary | ICD-10-CM

## 2017-08-26 DIAGNOSIS — R011 Cardiac murmur, unspecified: Secondary | ICD-10-CM

## 2017-08-26 NOTE — Progress Notes (Signed)
Cardiology Office Note   Date:  08/26/2017   ID:  Breanna Neal, DOB [birth year], MRN [id number]  PCP:  Crist Infante, MD  Cardiologist:   Wrenn Willcox Martinique, MD   Chief Complaint  Patient presents with  . New Patient (Initial Visit)    hreat murmur  . Hypertension  . Heart Murmur      History of Present Illness: Breanna Neal is a 71 y.o. female who is seen at the request of Dr. Joylene Draft for evaluation of a heart murmur and LVH. She reports that she was told she had a murmur as a child when she had scarlet fever. She has never had formal cardiac evaluation. She does have a history of DM type 2, HLD, and HTN with significant white coat component.  She denies any cardiac complaints. Specifically she denies chest pain, SOB, palpitations, edema, PND, orthopnea. Energy level is good and she is active with yard work and gardening. No lightheadedness or dizziness. No syncope. Father had a mild MI in his late 15s but lived well into his 14s. No family history of premature death or cardiomyopathy.     Past Medical History:  Diagnosis Date  . Breast CA ([place])   . Breast cancer ([place]) 01/2011   right  . Controlled diabetes mellitus with microalbuminuria ([place])   . Diabetes mellitus   . Fibroids   . Heart murmur   . History of recurrent UTIs   . Hyperlipidemia   . Hypertension   . Osteopenia     Past Surgical History:  Procedure Laterality Date  . ABDOMINAL HYSTERECTOMY    . CATARACT EXTRACTION Left   . CHOLECYSTECTOMY    . ECTOPIC PREGNANCY SURGERY    . SIMPLE MASTECTOMY  01/28/2011   right side;with reconstruction by Dr Harlow Mares     Current Outpatient Prescriptions  Medication Sig Dispense Refill  . anastrozole (ARIMIDEX) 1 MG tablet TAKE 1 TABLET (1 MG TOTAL) BY MOUTH DAILY. 90 tablet 3  . aspirin 81 MG chewable tablet Chew 81 mg by mouth 2 (two) times a week.     Marland Kitchen atorvastatin (LIPITOR) 80 MG tablet Take 40 mg by mouth daily.     . Calcium Carbonate-Vitamin D (CALCIUM  600 + D PO) Take by mouth daily.     . Cholecalciferol (VITAMIN D) 1000 UNITS capsule Take 5,000 Units by mouth. Every other week    . fexofenadine (ALLEGRA) 30 MG tablet Take 30 mg by mouth as needed.     . irbesartan (AVAPRO) 75 MG tablet Take 1 tablet by mouth daily.    . Omega-3 Fatty Acids (OMEGA-3 FISH OIL) 1200 MG CAPS Take 2 capsules by mouth daily.     Marland Kitchen TRIMETHOPRIM PO Take 100 mg by mouth as needed.      No current facility-administered medications for this visit.     Allergies:   Patient has no known allergies.    Social History:  The patient  reports that she has quit smoking. She has never used smokeless tobacco. She reports that she drinks alcohol. She reports that she does not use drugs.   Family History:  The patient's family history includes Cancer in her mother and sister; Healthy in her son; Heart disease in her father.    ROS:  Please see the history of present illness.   Otherwise, review of systems are positive for none.   All other systems are reviewed and negative.    PHYSICAL EXAM: VS:  BP 140/80 (BP  Location: Left Arm, Patient Position: Sitting, Cuff Size: Normal) Comment: ca only check left arm, breast cancer survivor  Pulse 77   Ht 5\' 4"  (1.626 m)   Wt 154 lb (69.9 kg)   BMI 26.43 kg/m  , BMI Body mass index is 26.43 kg/m. GEN: Well nourished, overweight, in no acute distress  HEENT: normal  Neck: no JVD, carotid bruits, or masses Cardiac: RRR; there is a soft 1/6 systolic murmur RUSB. No  rubs or gallops,no edema  Respiratory:  clear to auscultation bilaterally, normal work of breathing GI: soft, nontender, nondistended, + BS MS: no deformity or atrophy  Skin: warm and dry, no rash Neuro:  Strength and sensation are intact Psych: euthymic mood, full affect   EKG:  EKG is ordered today. The ekg ordered today demonstrates NSR with occ. PAC. Otherwise normal. I have personally reviewed and interpreted this study.    Recent Labs: No results  found for requested labs within last 8760 hours.    Lipid Panel No results found for: CHOL, TRIG, HDL, CHOLHDL, VLDL, LDLCALC, LDLDIRECT    Wt Readings from Last 3 Encounters:  08/26/17 154 lb (69.9 kg)  06/25/17 159 lb 3.2 oz (72.2 kg)  06/12/16 158 lb 14.4 oz (72.1 kg)      Other studies Reviewed:  Labs from Dr. Joylene Draft: 08/17/17: Glucose 150. Total bili 1.6. Otherwise CMET normal. CBC normal. Cholesterol 125, triglycerides 186, HDL32, LDL 56. TSH normal. A1c 6.8%.  Additional studies/ records that were reviewed today include:   Echo 08/21/17: Study Conclusions  - Left ventricle: The cavity size was normal. There was moderate   concentric hypertrophy. Systolic function was normal. The   estimated ejection fraction was in the range of 60% to 65%. There   was dynamic obstruction at rest, with a peak velocity of 91   cm/sec and a peak gradient of 3 mm Hg. Wall motion was normal;   there were no regional wall motion abnormalities. Doppler   parameters are consistent with abnormal left ventricular   relaxation (grade 1 diastolic dysfunction). Doppler parameters   are consistent with high ventricular filling pressure. - Aortic valve: Transvalvular velocity was within the normal range.   There was no stenosis. There was no regurgitation. - Mitral valve: Transvalvular velocity was within the normal range.   There was no evidence for stenosis. There was no regurgitation. - Right ventricle: The cavity size was normal. Wall thickness was   normal. Systolic function was normal. - Atrial septum: No defect or patent foramen ovale was identified   by color flow Doppler. - Tricuspid valve: There was no regurgitation.   ASSESSMENT AND PLAN:  1.  Heart murmur. Exam is fairly benign today. I personally reviewed her Echo. She has a small LV size with moderate LVH and vigorous systolic function. The septum is somewhat sigmoid shaped with some narrowing of the LVOT. There is a gradient by  doppler. No SAM. No real features of HOCM. I think this just represents hypertensive heart disease. She has no symptoms so I think therapy just needs to focus on BP control. She has no evidence of CHF and no dizziness related to LVOT gradient. While a beta blocker may be a good choice she seems to be doing well currently on an ARB. If she were to need additional antihypertensive therapy in the future a beta blocker would be advised.  2. HTN BP control is satisfactory. See #1. 3. DM type 2 with microalbuminuria 4. Dyslipidemia  Current medicines  are reviewed at length with the patient today.  The patient does not have concerns regarding medicines.  The following changes have been made:  no change  Labs/ tests ordered today include:   Orders Placed This Encounter  Procedures  . EKG 12-Lead     Disposition:   FU with me prn Signed, Heidie Krall Martinique, MD  08/26/2017 9:16 AM    [facility] [address] [place], [place], [address] Phone [phone], Fax [phone]

## 2017-08-26 NOTE — Patient Instructions (Signed)
Continue your current therapy  I will see you as needed. 

## 2017-08-28 DIAGNOSIS — H0014 Chalazion left upper eyelid: Secondary | ICD-10-CM | POA: Diagnosis not present

## 2017-12-07 DIAGNOSIS — Z853 Personal history of malignant neoplasm of breast: Secondary | ICD-10-CM | POA: Diagnosis not present

## 2017-12-07 DIAGNOSIS — Z1231 Encounter for screening mammogram for malignant neoplasm of breast: Secondary | ICD-10-CM | POA: Diagnosis not present

## 2018-01-05 DIAGNOSIS — M859 Disorder of bone density and structure, unspecified: Secondary | ICD-10-CM | POA: Diagnosis not present

## 2018-01-19 ENCOUNTER — Other Ambulatory Visit: Payer: Self-pay | Admitting: Hematology and Oncology

## 2018-01-19 DIAGNOSIS — C50311 Malignant neoplasm of lower-inner quadrant of right female breast: Secondary | ICD-10-CM

## 2018-02-22 DIAGNOSIS — E119 Type 2 diabetes mellitus without complications: Secondary | ICD-10-CM | POA: Diagnosis not present

## 2018-02-22 DIAGNOSIS — I10 Essential (primary) hypertension: Secondary | ICD-10-CM | POA: Diagnosis not present

## 2018-02-22 DIAGNOSIS — Z6827 Body mass index (BMI) 27.0-27.9, adult: Secondary | ICD-10-CM | POA: Diagnosis not present

## 2018-02-22 DIAGNOSIS — L0390 Cellulitis, unspecified: Secondary | ICD-10-CM | POA: Diagnosis not present

## 2018-02-22 DIAGNOSIS — R030 Elevated blood-pressure reading, without diagnosis of hypertension: Secondary | ICD-10-CM | POA: Diagnosis not present

## 2018-04-27 DIAGNOSIS — H52202 Unspecified astigmatism, left eye: Secondary | ICD-10-CM | POA: Diagnosis not present

## 2018-04-27 DIAGNOSIS — H2511 Age-related nuclear cataract, right eye: Secondary | ICD-10-CM | POA: Diagnosis not present

## 2018-05-28 DIAGNOSIS — Z85828 Personal history of other malignant neoplasm of skin: Secondary | ICD-10-CM | POA: Diagnosis not present

## 2018-05-28 DIAGNOSIS — D1801 Hemangioma of skin and subcutaneous tissue: Secondary | ICD-10-CM | POA: Diagnosis not present

## 2018-05-28 DIAGNOSIS — D225 Melanocytic nevi of trunk: Secondary | ICD-10-CM | POA: Diagnosis not present

## 2018-05-28 DIAGNOSIS — D2261 Melanocytic nevi of right upper limb, including shoulder: Secondary | ICD-10-CM | POA: Diagnosis not present

## 2018-05-28 DIAGNOSIS — D2272 Melanocytic nevi of left lower limb, including hip: Secondary | ICD-10-CM | POA: Diagnosis not present

## 2018-05-28 DIAGNOSIS — L821 Other seborrheic keratosis: Secondary | ICD-10-CM | POA: Diagnosis not present

## 2018-05-28 DIAGNOSIS — D2271 Melanocytic nevi of right lower limb, including hip: Secondary | ICD-10-CM | POA: Diagnosis not present

## 2018-05-28 DIAGNOSIS — D2372 Other benign neoplasm of skin of left lower limb, including hip: Secondary | ICD-10-CM | POA: Diagnosis not present

## 2018-05-28 DIAGNOSIS — L570 Actinic keratosis: Secondary | ICD-10-CM | POA: Diagnosis not present

## 2018-06-25 ENCOUNTER — Inpatient Hospital Stay: Payer: Medicare Other | Attending: Hematology and Oncology | Admitting: Hematology and Oncology

## 2018-06-25 ENCOUNTER — Telehealth: Payer: Self-pay | Admitting: Hematology and Oncology

## 2018-06-25 DIAGNOSIS — D0511 Intraductal carcinoma in situ of right breast: Secondary | ICD-10-CM | POA: Diagnosis not present

## 2018-06-25 DIAGNOSIS — Z86000 Personal history of in-situ neoplasm of breast: Secondary | ICD-10-CM | POA: Diagnosis not present

## 2018-06-25 DIAGNOSIS — C50311 Malignant neoplasm of lower-inner quadrant of right female breast: Secondary | ICD-10-CM

## 2018-06-25 DIAGNOSIS — N951 Menopausal and female climacteric states: Secondary | ICD-10-CM | POA: Diagnosis not present

## 2018-06-25 DIAGNOSIS — Z170 Estrogen receptor positive status [ER+]: Secondary | ICD-10-CM

## 2018-06-25 DIAGNOSIS — M8580 Other specified disorders of bone density and structure, unspecified site: Secondary | ICD-10-CM | POA: Insufficient documentation

## 2018-06-25 MED ORDER — VALSARTAN 160 MG PO TABS: 160.0000 mg | ORAL_TABLET | Freq: Every day | ORAL | Status: AC

## 2018-06-25 NOTE — Assessment & Plan Note (Signed)
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  Anastrozole toxicities: No major side effects   Surveillance for breast cancer: 1. Breast exam 06/25/2018 patient seen and normal 2. Mammogram done at [facility] done December 2017 on left breast is normal 3. DEXA scan 08/30/2014: Osteopenia T score -1.6: Continue with calcium and vitamin D. Patient's primary care physician has been ordering this DEXA scans.  Since he completed 7 years of therapy, I recommended that she can discontinue anastrozole at this time.  Neurofibroma: followed by [facility] neurosurgery  Return to clinic in 1 year for follow-up with long-term survivorship clinic with [facility].

## 2018-06-25 NOTE — Progress Notes (Signed)
Patient Care Team: Crist Infante, MD as PCP - General (Internal Medicine) Lorelle Gibbs, MD as Consulting Physician (Radiology) Romine, Lubertha South, MD as Consulting Physician (Obstetrics and Gynecology) Crissie Reese, MD as Consulting Physician (Plastic Surgery) Marcy Panning, MD as Consulting Physician (Internal Medicine)  DIAGNOSIS:  Encounter Diagnosis  Name Primary?  . Malignant neoplasm of lower-inner quadrant of right breast of female, estrogen receptor positive ([place])     SUMMARY OF ONCOLOGIC HISTORY:   Breast cancer of lower-inner quadrant of right female breast ([place])   12/31/2010 Initial Diagnosis    Invasive ductal carcinoma grade 1 ER 90% PR 96% Ki-67 11% HER-2 negative ratio 1.08      01/30/2011 Surgery    Right breast mastectomy: No invasive cancer, high-grade DCIS with comedonecrosis 3 lymph nodes negative: Followed by immediate reconstruction with implant      05/29/2011 -  Anti-estrogen oral therapy    Aromasin 25 mg daily, due to cost, changed to Arimidex 1 mg daily 12/04/2014       CHIEF COMPLIANT: Follow-up on Arimidex therapy having completed 7 years of treatment  INTERVAL HISTORY: Breanna Neal is a 72 year old with above-mentioned history of right breast cancer treated with mastectomy and is currently on Arimidex therapy.  She has completed total of 7 years of therapy and is ready to stop the treatment at this time.  She has some muscle cramping in her toes but otherwise she has done extremely well.  She has occasional hot flashes.  She denies any lumps or nodules in the breast.  REVIEW OF SYSTEMS:   Constitutional: Denies fevers, chills or abnormal weight loss Eyes: Denies blurriness of vision Ears, nose, mouth, throat, and face: Denies mucositis or sore throat Respiratory: Denies cough, dyspnea or wheezes Cardiovascular: Denies palpitation, chest discomfort Gastrointestinal:  Denies nausea, heartburn or change in bowel habits Skin: Denies  abnormal skin rashes Lymphatics: Denies new lymphadenopathy or easy bruising Neurological:Denies numbness, tingling or new weaknesses Behavioral/Psych: Mood is stable, no new changes  Extremities: No lower extremity edema Breast:  denies any pain or lumps or nodules in either breasts All other systems were reviewed with the patient and are negative.  I have reviewed the past medical history, past surgical history, social history and family history with the patient and they are unchanged from previous note.  ALLERGIES:  has No Known Allergies.  MEDICATIONS:  Current Outpatient Medications  Medication Sig Dispense Refill  . aspirin 81 MG chewable tablet Chew 81 mg by mouth 2 (two) times a week.     Marland Kitchen atorvastatin (LIPITOR) 80 MG tablet Take 40 mg by mouth daily.     . Calcium Carbonate-Vitamin D (CALCIUM 600 + D PO) Take by mouth daily.     . Cholecalciferol (VITAMIN D) 1000 UNITS capsule Take 5,000 Units by mouth. Every other week    . fexofenadine (ALLEGRA) 30 MG tablet Take 30 mg by mouth as needed.     . Omega-3 Fatty Acids (OMEGA-3 FISH OIL) 1200 MG CAPS Take 2 capsules by mouth daily.     Marland Kitchen TRIMETHOPRIM PO Take 100 mg by mouth as needed.     . valsartan (DIOVAN) 160 MG tablet Take 1 tablet (160 mg total) by mouth daily.     No current facility-administered medications for this visit.     PHYSICAL EXAMINATION: ECOG PERFORMANCE STATUS: 1 - Symptomatic but completely ambulatory  Vitals:   06/25/18 1014  BP: (!) 154/75  Pulse: 73  Resp: 18  Temp: 98.7  F (37.1 C)  SpO2: 99%   Filed Weights   06/25/18 1014  Weight: 159 lb 11.2 oz (72.4 kg)    GENERAL:alert, no distress and comfortable SKIN: skin color, texture, turgor are normal, no rashes or significant lesions EYES: normal, Conjunctiva are pink and non-injected, sclera clear OROPHARYNX:no exudate, no erythema and lips, buccal mucosa, and tongue normal  NECK: supple, thyroid normal size, non-tender, without  nodularity LYMPH:  no palpable lymphadenopathy in the cervical, axillary or inguinal LUNGS: clear to auscultation and percussion with normal breathing effort HEART: regular rate & rhythm and no murmurs and no lower extremity edema ABDOMEN:abdomen soft, non-tender and normal bowel sounds MUSCULOSKELETAL:no cyanosis of digits and no clubbing  NEURO: alert & oriented x 3 with fluent speech, no focal motor/sensory deficits EXTREMITIES: No lower extremity edema BREAST: No palpable masses or nodules in either right or left breasts. No palpable axillary supraclavicular or infraclavicular adenopathy no breast tenderness or nipple discharge. (exam performed in the presence of a chaperone)  LABORATORY DATA:  I have reviewed the data as listed CMP Latest Ref Rng & Units 09/04/2014 12/01/2013 06/09/2013  Glucose 70 - 140 mg/dl 125 114 107(H)  BUN 7.0 - 26.0 mg/dL 15.5 11.7 11.4  Creatinine 0.6 - 1.1 mg/dL 0.8 0.8 0.7  Sodium 136 - 145 mEq/L 141 141 140  Potassium 3.5 - 5.1 mEq/L 4.6 4.0 4.3  Chloride 98 - 107 mEq/L - - 105  CO2 22 - 29 mEq/L '27 24 25  ' Calcium 8.4 - 10.4 mg/dL 10.0 10.2 9.9  Total Protein 6.4 - 8.3 g/dL 7.6 7.4 7.5  Total Bilirubin 0.20 - 1.20 mg/dL 1.66(H) 1.59(H) 1.93(H)  Alkaline Phos 40 - 150 U/L 80 79 79  AST 5 - 34 U/L '21 29 20  ' ALT 0 - 55 U/L 26 43 29    Lab Results  Component Value Date   WBC 8.9 09/04/2014   HGB 16.4 (H) 09/04/2014   HCT 49.3 (H) 09/04/2014   MCV 89.7 09/04/2014   PLT 224 09/04/2014   NEUTROABS 5.6 09/04/2014    ASSESSMENT & PLAN:  Breast cancer of lower-inner quadrant of right female breast Stage I invasive ductal carcinoma with DCIS right breast: Initial biopsy showed invasive ductal carcinoma of the final mastectomy only showed DCIS ER/PR positive HER-2 negative: Switched from Aromasin to anastrozole December 2015.  Anastrozole toxicities: No major side effects   Surveillance for breast cancer: 1. Breast exam 06/25/2018 patient seen and  normal 2. Mammogram done at [facility] done December 2017 on left breast is normal 3. DEXA scan 08/30/2014: Osteopenia T score -1.6: Continue with calcium and vitamin D. Patient's primary care physician has been ordering this DEXA scans.  Since he completed 7 years of therapy, I recommended that she can discontinue anastrozole at this time.  Neurofibroma: followed by [facility]  Return to clinic in 1 year for follow-up with long-term survivorship clinic with [facility].    No orders of the defined types were placed in this encounter.  The patient has a good understanding of the overall plan. she agrees with it. she will call with any problems that may develop before the next visit here.   Harriette Ohara, MD 06/25/18

## 2018-06-25 NOTE — Telephone Encounter (Signed)
Gave patient AVS and Calendar.  °

## 2018-09-21 DIAGNOSIS — E7849 Other hyperlipidemia: Secondary | ICD-10-CM | POA: Diagnosis not present

## 2018-09-21 DIAGNOSIS — R82998 Other abnormal findings in urine: Secondary | ICD-10-CM | POA: Diagnosis not present

## 2018-09-21 DIAGNOSIS — M859 Disorder of bone density and structure, unspecified: Secondary | ICD-10-CM | POA: Diagnosis not present

## 2018-09-21 DIAGNOSIS — I10 Essential (primary) hypertension: Secondary | ICD-10-CM | POA: Diagnosis not present

## 2018-09-21 DIAGNOSIS — E119 Type 2 diabetes mellitus without complications: Secondary | ICD-10-CM | POA: Diagnosis not present

## 2018-09-25 DIAGNOSIS — Z23 Encounter for immunization: Secondary | ICD-10-CM | POA: Diagnosis not present

## 2018-09-28 DIAGNOSIS — C50919 Malignant neoplasm of unspecified site of unspecified female breast: Secondary | ICD-10-CM | POA: Diagnosis not present

## 2018-09-28 DIAGNOSIS — Z1231 Encounter for screening mammogram for malignant neoplasm of breast: Secondary | ICD-10-CM | POA: Diagnosis not present

## 2018-09-28 DIAGNOSIS — M5416 Radiculopathy, lumbar region: Secondary | ICD-10-CM | POA: Diagnosis not present

## 2018-09-28 DIAGNOSIS — E119 Type 2 diabetes mellitus without complications: Secondary | ICD-10-CM | POA: Diagnosis not present

## 2018-09-28 DIAGNOSIS — Z1389 Encounter for screening for other disorder: Secondary | ICD-10-CM | POA: Diagnosis not present

## 2018-09-28 DIAGNOSIS — R011 Cardiac murmur, unspecified: Secondary | ICD-10-CM | POA: Diagnosis not present

## 2018-09-28 DIAGNOSIS — Z0000 Encounter for general adult medical examination without abnormal findings: Secondary | ICD-10-CM | POA: Diagnosis not present

## 2018-09-28 DIAGNOSIS — Z6827 Body mass index (BMI) 27.0-27.9, adult: Secondary | ICD-10-CM | POA: Diagnosis not present

## 2018-09-28 DIAGNOSIS — R509 Fever, unspecified: Secondary | ICD-10-CM | POA: Diagnosis not present

## 2018-09-28 DIAGNOSIS — I517 Cardiomegaly: Secondary | ICD-10-CM | POA: Diagnosis not present

## 2018-09-28 DIAGNOSIS — I10 Essential (primary) hypertension: Secondary | ICD-10-CM | POA: Diagnosis not present

## 2018-10-01 DIAGNOSIS — Z1212 Encounter for screening for malignant neoplasm of rectum: Secondary | ICD-10-CM | POA: Diagnosis not present

## 2018-11-26 DIAGNOSIS — Z1211 Encounter for screening for malignant neoplasm of colon: Secondary | ICD-10-CM | POA: Diagnosis not present

## 2018-11-26 DIAGNOSIS — Z800 Family history of malignant neoplasm of digestive organs: Secondary | ICD-10-CM | POA: Diagnosis not present

## 2018-11-26 DIAGNOSIS — D123 Benign neoplasm of transverse colon: Secondary | ICD-10-CM | POA: Diagnosis not present

## 2018-11-30 DIAGNOSIS — D123 Benign neoplasm of transverse colon: Secondary | ICD-10-CM | POA: Diagnosis not present

## 2018-12-17 ENCOUNTER — Encounter: Payer: Self-pay | Admitting: Hematology and Oncology

## 2018-12-17 DIAGNOSIS — Z853 Personal history of malignant neoplasm of breast: Secondary | ICD-10-CM | POA: Diagnosis not present

## 2018-12-17 DIAGNOSIS — Z1231 Encounter for screening mammogram for malignant neoplasm of breast: Secondary | ICD-10-CM | POA: Diagnosis not present

## 2018-12-27 NOTE — Progress Notes (Signed)
[facility] mm report received. Sent to scan

## 2019-06-01 DIAGNOSIS — D2272 Melanocytic nevi of left lower limb, including hip: Secondary | ICD-10-CM | POA: Diagnosis not present

## 2019-06-01 DIAGNOSIS — D1801 Hemangioma of skin and subcutaneous tissue: Secondary | ICD-10-CM | POA: Diagnosis not present

## 2019-06-01 DIAGNOSIS — D2271 Melanocytic nevi of right lower limb, including hip: Secondary | ICD-10-CM | POA: Diagnosis not present

## 2019-06-01 DIAGNOSIS — Z85828 Personal history of other malignant neoplasm of skin: Secondary | ICD-10-CM | POA: Diagnosis not present

## 2019-06-01 DIAGNOSIS — L821 Other seborrheic keratosis: Secondary | ICD-10-CM | POA: Diagnosis not present

## 2019-06-01 DIAGNOSIS — L814 Other melanin hyperpigmentation: Secondary | ICD-10-CM | POA: Diagnosis not present

## 2019-06-01 DIAGNOSIS — D225 Melanocytic nevi of trunk: Secondary | ICD-10-CM | POA: Diagnosis not present

## 2019-06-09 DIAGNOSIS — H524 Presbyopia: Secondary | ICD-10-CM | POA: Diagnosis not present

## 2019-06-09 DIAGNOSIS — H2511 Age-related nuclear cataract, right eye: Secondary | ICD-10-CM | POA: Diagnosis not present

## 2019-06-10 ENCOUNTER — Telehealth: Payer: Self-pay | Admitting: Adult Health

## 2019-06-10 NOTE — Telephone Encounter (Signed)
I LEFT PATIENT A MESSAGE REGARDING VISIT

## 2019-06-20 ENCOUNTER — Telehealth: Payer: Self-pay | Admitting: Adult Health

## 2019-06-20 NOTE — Telephone Encounter (Signed)
I left a message regarding reschedule °

## 2019-06-27 ENCOUNTER — Telehealth: Payer: Medicare Other | Admitting: Adult Health

## 2019-06-28 ENCOUNTER — Telehealth: Payer: Self-pay | Admitting: Adult Health

## 2019-06-28 NOTE — Telephone Encounter (Signed)
Confirmed appt/verified info °

## 2019-06-29 ENCOUNTER — Inpatient Hospital Stay: Payer: Medicare Other | Attending: Adult Health | Admitting: Adult Health

## 2019-06-29 ENCOUNTER — Encounter: Payer: Self-pay | Admitting: Adult Health

## 2019-06-29 DIAGNOSIS — C50311 Malignant neoplasm of lower-inner quadrant of right female breast: Secondary | ICD-10-CM | POA: Diagnosis not present

## 2019-06-29 DIAGNOSIS — Z79811 Long term (current) use of aromatase inhibitors: Secondary | ICD-10-CM

## 2019-06-29 DIAGNOSIS — Z9011 Acquired absence of right breast and nipple: Secondary | ICD-10-CM | POA: Diagnosis not present

## 2019-06-29 DIAGNOSIS — Z170 Estrogen receptor positive status [ER+]: Secondary | ICD-10-CM

## 2019-06-29 NOTE — Progress Notes (Signed)
SURVIVORSHIP VIRTUAL VISIT:  I connected with Breanna Neal on 06/29/19 at  9:00 AM EDT by video and verified that I am speaking with the correct person using two identifiers.   I discussed the limitations, risks, security and privacy concerns of performing an evaluation and management service by video and the availability of in person appointments. I also discussed with the patient that there may be a patient responsible charge related to this service. The patient expressed understanding and agreed to proceed.    REASON FOR VISIT:  Routine follow-up for history of breast cancer.   BRIEF ONCOLOGIC HISTORY:  Oncology History  Breast cancer of lower-inner quadrant of right female breast ([place])  12/31/2010 Initial Diagnosis   Invasive ductal carcinoma grade 1 ER 90% PR 96% Ki-67 11% HER-2 negative ratio 1.08   01/30/2011 Surgery   Right breast mastectomy: No invasive cancer, high-grade DCIS with comedonecrosis 3 lymph nodes negative: Followed by immediate reconstruction with implant   05/29/2011 - 06/25/2018 Anti-estrogen oral therapy   Aromasin 25 mg daily, due to cost, changed to Arimidex 1 mg daily 12/04/2014      INTERVAL HISTORY:  Breanna Neal presents to the [facility] today for routine follow-up for her history of breast cancer.  Overall, she reports feeling quite well.   Since her last visit, she underwent a left sided 3d screening mammogram 11/2018 that showed no evidence of malignancy and breast density category C.  Breanna Neal has had no medical issues this year.  She notes she is having her usual chronic sinus issues.  She continues to see her PCP regularly.  She also sees dermatology regularly, along with dentistry.  She also has a urologist that she hasn't seen in a couple of years due to a remote kidney issue.  Breanna Neal is up to date with her colon cancer screening.  She no longer has gyn exams due to h/o hysterectomy.  Breanna Neal does not exercise regularly.  She eats  protein and vegetables, but notes that she enjoys sweets.    REVIEW OF SYSTEMS:  Review of Systems  Constitutional: Negative for appetite change, chills, fatigue, fever and unexpected weight change.  HENT:   Negative for hearing loss, lump/mass, sore throat, tinnitus and trouble swallowing.   Eyes: Negative for eye problems and icterus.  Respiratory: Negative for chest tightness, cough and shortness of breath.   Cardiovascular: Negative for chest pain, leg swelling and palpitations.  Gastrointestinal: Negative for abdominal distention, abdominal pain, constipation, diarrhea, nausea and vomiting.  Endocrine: Negative for hot flashes.  Musculoskeletal: Negative for arthralgias.  Skin: Negative for itching and rash.  Neurological: Negative for dizziness, extremity weakness and headaches.  Hematological: Negative for adenopathy. Does not bruise/bleed easily.  Psychiatric/Behavioral: Negative for depression. The patient is not nervous/anxious.   Breast: Denies any new nodularity, masses, tenderness, nipple changes, or nipple discharge.       PAST MEDICAL/SURGICAL HISTORY:  Past Medical History:  Diagnosis Date  . Breast CA ([place])   . Breast cancer ([place]) 01/2011   right  . Controlled diabetes mellitus with microalbuminuria ([place])   . Diabetes mellitus   . Fibroids   . Heart murmur   . History of recurrent UTIs   . Hyperlipidemia   . Hypertension   . Osteopenia    Past Surgical History:  Procedure Laterality Date  . ABDOMINAL HYSTERECTOMY    . CATARACT EXTRACTION Left   . CHOLECYSTECTOMY    . ECTOPIC PREGNANCY SURGERY    . SIMPLE MASTECTOMY  01/28/2011  right side;with reconstruction by Dr Harlow Mares     ALLERGIES:  No Known Allergies   CURRENT MEDICATIONS:  Outpatient Encounter Medications as of 06/29/2019  Medication Sig Note  . aspirin 81 MG chewable tablet Chew 81 mg by mouth 2 (two) times a week.  12/09/2012: 2x per week  . atorvastatin (LIPITOR) 80 MG tablet Take 40 mg  by mouth daily.    . Calcium Carbonate-Vitamin D (CALCIUM 600 + D PO) Take by mouth daily.  12/09/2012: Calcium 630/ Vit d 500iu  . Cholecalciferol (VITAMIN D) 1000 UNITS capsule Take 5,000 Units by mouth. Every other week   . fexofenadine (ALLEGRA) 30 MG tablet Take 30 mg by mouth as needed.    . Omega-3 Fatty Acids (OMEGA-3 FISH OIL) 1200 MG CAPS Take 2 capsules by mouth daily.    Marland Kitchen TRIMETHOPRIM PO Take 100 mg by mouth as needed.    . valsartan (DIOVAN) 160 MG tablet Take 1 tablet (160 mg total) by mouth daily.    No facility-administered encounter medications on file as of 06/29/2019.      ONCOLOGIC FAMILY HISTORY:  Family History  Problem Relation Age of Onset  . Cancer Mother   . Heart disease Father   . Cancer Sister   . Healthy Son     GENETIC COUNSELING/TESTING: Patient eligible, reviewed today and discussed, patient would like to think about it and re review in one year  SOCIAL HISTORY:  Social History   Socioeconomic History  . Marital status: Married    Spouse name: Not on file  . Number of children: 1  . Years of education: Not on file  . Highest education level: Not on file  Occupational History  . Occupation: Programmer, applications  Social Needs  . Financial resource strain: Not on file  . Food insecurity    Worry: Not on file    Inability: Not on file  . Transportation needs    Medical: Not on file    Non-medical: Not on file  Tobacco Use  . Smoking status: Former Research scientist (life sciences)  . Smokeless tobacco: Never Used  Substance and Sexual Activity  . Alcohol use: Yes    Comment: OCCASIONAL WINE  . Drug use: No  . Sexual activity: Yes  Lifestyle  . Physical activity    Days per week: Not on file    Minutes per session: Not on file  . Stress: Not on file  Relationships  . Social Herbalist on phone: Not on file    Gets together: Not on file    Attends religious service: Not on file    Active member of club or organization: Not on file    Attends meetings  of clubs or organizations: Not on file    Relationship status: Not on file  . Intimate partner violence    Fear of current or ex partner: Not on file    Emotionally abused: Not on file    Physically abused: Not on file    Forced sexual activity: Not on file  Other Topics Concern  . Not on file  Social History Narrative  . Not on file      OBJECTIVE:  Patient appears well.  She is in no apparent distress.  Her breathing is non labored.  Skin visualized without rash or lesion.  Neuro is focally intact.  Mood and behavior are normal.  LABORATORY DATA:  None for this visit   DIAGNOSTIC IMAGING:  Most recent mammogram:  ASSESSMENT AND PLAN:  Breanna Neal is a pleasant 73 y.o. female with history of Stage IA right breast invasive ductal carcinoma, ER+/PR+/HER2-, diagnosed in 12/2010, treated with mastectomy and 7 years of antiestrogen therapy with Exemestane followed by Anastrozole completing in 06/25/2018.  She presents to the Survivorship Clinic for surveillance and routine follow-up.   1. History of breast cancer:  Breanna Neal is currently clinically and radiographically without evidence of disease or recurrence of breast cancer. She will be due for mammogram in 11/2019; orders placed today.  We discussed that since she has a saline implant, she does not need imaging to evaluate for silent rupture.  She understands this.  We will continue to monitor that breast with physical exam and breast awareness. She will return in one year for LTS follow up.  I encouraged her to call me with any questions or concerns before her next visit at the cancer center, and I would be happy to see her sooner, if needed.    2. Bone health:  Given Breanna Neal age, history of breast cancer, and her previous anti-estrogen therapy with with aromatase inhibitors, she is at risk for bone demineralization. She undergoes bone density testing with her PCP, Dr. Joylene Draft.  She isn't quite sure of the date.  We have requested  a copy of the results.  She notes her PCP does a fantastic job managing this.  She was given education on specific food and activities to promote bone health.  3. Cancer screening:  Due to Breanna Neal history and her age, she should receive screening for skin cancers, colon cancer. She was encouraged to follow-up with her PCP for appropriate cancer screenings.   4. Health maintenance and wellness promotion: Breanna Neal was encouraged to consume 5-7 servings of fruits and vegetables per day. She was also encouraged to engage in moderate to vigorous exercise for 30 minutes per day most days of the week. She was instructed to limit her alcohol consumption and continue to abstain from tobacco use.   Follow up instructions:    -Return to cancer center in one year for LTS follow up  -Mammogram due in 11/2019    The patient was provided an opportunity to ask questions and all were answered. The patient agreed with the plan and demonstrated an understanding of the instructions.   The patient was advised to call back or seek an in-person evaluation if the symptoms worsen or if the condition fails to improve as anticipated.   I provided 20 minutes of face-to-face video visit time during this encounter, and > 50% was spent counseling as documented under my assessment & plan.   Gardenia Phlegm, NP Survivorship Program [facility] [phone]   Note: PRIMARY CARE PROVIDER Crist Infante, [place] [phone]

## 2019-09-03 DIAGNOSIS — Z23 Encounter for immunization: Secondary | ICD-10-CM | POA: Diagnosis not present

## 2019-11-10 DIAGNOSIS — E119 Type 2 diabetes mellitus without complications: Secondary | ICD-10-CM | POA: Diagnosis not present

## 2019-11-10 DIAGNOSIS — M859 Disorder of bone density and structure, unspecified: Secondary | ICD-10-CM | POA: Diagnosis not present

## 2019-11-10 DIAGNOSIS — E7849 Other hyperlipidemia: Secondary | ICD-10-CM | POA: Diagnosis not present

## 2019-11-14 DIAGNOSIS — R82998 Other abnormal findings in urine: Secondary | ICD-10-CM | POA: Diagnosis not present

## 2019-11-14 DIAGNOSIS — R809 Proteinuria, unspecified: Secondary | ICD-10-CM | POA: Diagnosis not present

## 2019-11-14 DIAGNOSIS — E785 Hyperlipidemia, unspecified: Secondary | ICD-10-CM | POA: Diagnosis not present

## 2019-11-14 DIAGNOSIS — M5416 Radiculopathy, lumbar region: Secondary | ICD-10-CM | POA: Diagnosis not present

## 2019-11-14 DIAGNOSIS — Z1331 Encounter for screening for depression: Secondary | ICD-10-CM | POA: Diagnosis not present

## 2019-11-14 DIAGNOSIS — E119 Type 2 diabetes mellitus without complications: Secondary | ICD-10-CM | POA: Diagnosis not present

## 2019-11-14 DIAGNOSIS — C50919 Malignant neoplasm of unspecified site of unspecified female breast: Secondary | ICD-10-CM | POA: Diagnosis not present

## 2019-11-14 DIAGNOSIS — I517 Cardiomegaly: Secondary | ICD-10-CM | POA: Diagnosis not present

## 2019-11-14 DIAGNOSIS — R011 Cardiac murmur, unspecified: Secondary | ICD-10-CM | POA: Diagnosis not present

## 2019-11-14 DIAGNOSIS — Z0000 Encounter for general adult medical examination without abnormal findings: Secondary | ICD-10-CM | POA: Diagnosis not present

## 2019-11-14 DIAGNOSIS — R030 Elevated blood-pressure reading, without diagnosis of hypertension: Secondary | ICD-10-CM | POA: Diagnosis not present

## 2019-11-14 DIAGNOSIS — I10 Essential (primary) hypertension: Secondary | ICD-10-CM | POA: Diagnosis not present

## 2019-12-13 DIAGNOSIS — Z1212 Encounter for screening for malignant neoplasm of rectum: Secondary | ICD-10-CM | POA: Diagnosis not present

## 2019-12-26 DIAGNOSIS — N6324 Unspecified lump in the left breast, lower inner quadrant: Secondary | ICD-10-CM | POA: Diagnosis not present

## 2019-12-26 DIAGNOSIS — R922 Inconclusive mammogram: Secondary | ICD-10-CM | POA: Diagnosis not present

## 2020-01-02 ENCOUNTER — Other Ambulatory Visit: Payer: Self-pay | Admitting: Radiology

## 2020-01-02 DIAGNOSIS — N6324 Unspecified lump in the left breast, lower inner quadrant: Secondary | ICD-10-CM | POA: Diagnosis not present

## 2020-01-02 DIAGNOSIS — C50812 Malignant neoplasm of overlapping sites of left female breast: Secondary | ICD-10-CM | POA: Diagnosis not present

## 2020-01-11 ENCOUNTER — Telehealth: Payer: Self-pay | Admitting: Hematology and Oncology

## 2020-01-11 ENCOUNTER — Encounter: Payer: Self-pay | Admitting: Adult Health

## 2020-01-11 DIAGNOSIS — C50512 Malignant neoplasm of lower-outer quadrant of left female breast: Secondary | ICD-10-CM | POA: Insufficient documentation

## 2020-01-11 NOTE — Telephone Encounter (Signed)
Scheduled appt per 1/20 sch message - pt aware of appt date and time   

## 2020-01-12 ENCOUNTER — Other Ambulatory Visit (HOSPITAL_COMMUNITY): Payer: Self-pay | Admitting: Surgery

## 2020-01-12 DIAGNOSIS — Z170 Estrogen receptor positive status [ER+]: Secondary | ICD-10-CM | POA: Diagnosis not present

## 2020-01-12 DIAGNOSIS — C50912 Malignant neoplasm of unspecified site of left female breast: Secondary | ICD-10-CM | POA: Diagnosis not present

## 2020-01-12 DIAGNOSIS — C50512 Malignant neoplasm of lower-outer quadrant of left female breast: Secondary | ICD-10-CM

## 2020-01-12 DIAGNOSIS — Z853 Personal history of malignant neoplasm of breast: Secondary | ICD-10-CM | POA: Diagnosis not present

## 2020-01-13 ENCOUNTER — Ambulatory Visit: Payer: Medicare Other

## 2020-01-13 ENCOUNTER — Ambulatory Visit: Payer: Medicare Other | Attending: Internal Medicine

## 2020-01-13 ENCOUNTER — Telehealth: Payer: Self-pay | Admitting: *Deleted

## 2020-01-13 DIAGNOSIS — Z23 Encounter for immunization: Secondary | ICD-10-CM

## 2020-01-13 NOTE — Progress Notes (Signed)
   Covid-19 Vaccination Clinic  Name:  Breanna Neal    MRN: [id number] DOB: May 19, 1946  01/13/2020  [name] was observed post Covid-19 immunization for 15 minutes without incidence. She was provided with Vaccine Information Sheet and instruction to access the V-Safe system.   [name] was instructed to call 911 with any severe reactions post vaccine: Marland Kitchen Difficulty breathing  . Swelling of your face and throat  . A fast heartbeat  . A bad rash all over your body  . Dizziness and weakness    Immunizations Administered    Name Date Dose VIS Date Route   Pfizer COVID-19 Vaccine 01/13/2020 10:11 AM 0.3 mL 12/02/2019 Intramuscular   Manufacturer: [place]   Lot: [id number]   [place]: [id number]

## 2020-01-13 NOTE — Telephone Encounter (Signed)
error 

## 2020-01-16 NOTE — Progress Notes (Signed)
Location of Breast Cancer: Left Breast  Histology per Pathology Report:  01/02/2020 Diagnosis Breast, left, needle core biopsy, 6 o'clock, 9cmfn - INVASIVE DUCTAL CARCINOMA  Receptor Status: ER(100%), PR (5%), Her2-neu (NEG), Ki-(5%)  Did patient present with symptoms or was this found on screening mammography?: It was found on a screening mammogram.   Past/Anticipated interventions by surgeon, if any: 01/12/20 Initial consult with Dr. Lucia Gaskins for new left breast cancer  She has surgery scheduled for 01/30/20  Past/Anticipated interventions by medical oncology, if any:  06/29/2019 Wilber Bihari NP with survivorship ASSESSMENT AND PLAN:  Ms.. Audi is a pleasant 74 y.o. female with history of Stage IA right breast invasive ductal carcinoma, ER+/PR+/HER2-, diagnosed in 12/2010, treated with mastectomy and 7 years of antiestrogen therapy with Exemestane followed by Anastrozole completing in 06/25/2018.  She presents to the Survivorship Clinic for surveillance and routine follow-up.   1. History of breast cancer:  Ms. Plaugher is currently clinically and radiographically without evidence of disease or recurrence of breast cancer. She will be due for mammogram in 11/2019; orders placed today.  We discussed that since she has a saline implant, she does not need imaging to evaluate for silent rupture.  She understands this.  We will continue to monitor that breast with physical exam and breast awareness. She will return in one year for LTS follow up.  I encouraged her to call me with any questions or concerns before her next visit at the cancer center, and I would be happy to see her sooner, if needed.    ** She is scheduled to see Dr. Lindi Adie on 01/19/20  Lymphedema issues, if any:  N/A  Pain issues, if any:  She denies.   SAFETY ISSUES:  Prior radiation? No  Pacemaker/ICD? No  Possible current pregnancy? No  Is the patient on methotrexate? No  Current Complaints / other details:       Lilliana Turner, Stephani Police, RN 01/16/2020,10:27 AM

## 2020-01-17 NOTE — Progress Notes (Signed)
Radiation Oncology         [phone] ________________________________  Initial outpatient Consultation by telephone as patient was unable to access MyChart video during pandemic precautions   Name: Breanna Neal MRN: [id number]  Date: 01/18/2020  DOB: 1946/12/15  [id number], Elta Guadeloupe, MD  Alphonsa Overall, MD   REFERRING PHYSICIAN: Alphonsa Overall, MD  DIAGNOSIS:    ICD-10-CM   1. Malignant neoplasm of lower-outer quadrant of left breast of female, estrogen receptor positive ([place])  C50.512    Z17.0      Cancer Staging Breast cancer of lower-inner quadrant of right female breast [facility]) Staging form: Breast, AJCC 7th Edition - Clinical: No stage assigned - Unsigned - Pathologic: Stage IA (T1a, N0, cM0) - Signed by Rulon Eisenmenger, MD on 09/04/2014  Malignant neoplasm of lower-outer quadrant of left female breast [facility]) Staging form: Breast, AJCC 8th Edition - Clinical stage from 01/02/2020: Stage IA (cT1b, cN0, cM0, G2, ER+, PR+, HER2-) - Signed by Eppie Gibson, MD on 01/18/2020  CHIEF COMPLAINT: Here to discuss management of left breast cancer  HISTORY OF PRESENT ILLNESS::Breanna Neal is a 74 y.o. female with a history of right breast cancer diagnosed in 2012. She underwent right mastectomy on 01/30/2011, which showed no residual carcinoma and high grade DCIS. She was treated with aromasin from 05/2011 through 11/2014 under Dr. Lindi Adie. She was changed to anastrozole in 11/2014 due to cost and completed antiestrogen therapy in 06/2018. Her most recent encounter with medical oncology was in the survivorship clinic in 06/2019.  More recently, she presented with breast abnormality on the following imaging: screening left mammogram on the date of 12/20/2019.  Symptoms, if any, at that time, were: none.   Ultrasound of breast on 12/27/2019 revealed 1 cm lobulated left breast mass at 6 o'clock.   Biopsy on date of 01/02/2020 showed invasive ductal carcinoma.  ER status: positive (100%); PR status  positive (5%), Her2 status negative by FISH; Grade 2.  She is scheduled for left lumpectomy and sentinel lymph node biopsy on 01/30/2020 under Dr. Lucia Gaskins.  She is retired, staying at home due to the pandemic. She is active with computer work.  She has a dog and lives with her husband. Her son and grandchildren are nearby. She walks outside.  PREVIOUS RADIATION THERAPY: No  PAST MEDICAL HISTORY:  has a past medical history of Breast CA ([place]), Breast cancer ([place]) (01/2011), Controlled diabetes mellitus with microalbuminuria ([place]), Diabetes mellitus, Fibroids, Heart murmur, History of recurrent UTIs, Hyperlipidemia, Hypertension, and Osteopenia.    PAST SURGICAL HISTORY: Past Surgical History:  Procedure Laterality Date  . ABDOMINAL HYSTERECTOMY    . CATARACT EXTRACTION Left   . CHOLECYSTECTOMY    . ECTOPIC PREGNANCY SURGERY    . SIMPLE MASTECTOMY  01/28/2011   right side;with reconstruction by Dr Harlow Mares    FAMILY HISTORY: family history includes Cancer in her mother and sister; Healthy in her son; Heart disease in her father.  SOCIAL HISTORY:  reports that she has quit smoking. She has never used smokeless tobacco. She reports current alcohol use. She reports that she does not use drugs.  ALLERGIES: Patient has no known allergies.  MEDICATIONS:  Current Outpatient Medications  Medication Sig Dispense Refill  . aspirin 81 MG chewable tablet Chew 81 mg by mouth 2 (two) times a week.     Marland Kitchen atorvastatin (LIPITOR) 80 MG tablet Take 40 mg by mouth daily.     . Calcium Carbonate-Vitamin D (CALCIUM 600 + D PO) Take  by mouth daily.     . Cholecalciferol (VITAMIN D) 1000 UNITS capsule Take 5,000 Units by mouth. Every other week    . fexofenadine (ALLEGRA) 30 MG tablet Take 30 mg by mouth as needed.     . Omega-3 Fatty Acids (OMEGA-3 FISH OIL) 1200 MG CAPS Take 2 capsules by mouth daily.     Marland Kitchen TRIMETHOPRIM PO Take 100 mg by mouth as needed.     . valsartan (DIOVAN) 160 MG tablet Take 1 tablet  (160 mg total) by mouth daily.     No current facility-administered medications for this encounter.    REVIEW OF SYSTEMS: As above   PHYSICAL EXAM:  vitals were not taken for this visit.   General: Alert and oriented, in no acute distress Psychiatric: Judgment and insight are intact. Affect is appropriate.   LABORATORY DATA:  Lab Results  Component Value Date   WBC 8.9 09/04/2014   HGB 16.4 (H) 09/04/2014   HCT 49.3 (H) 09/04/2014   MCV 89.7 09/04/2014   PLT 224 09/04/2014   CMP     Component Value Date/Time   NA 141 09/04/2014 1336   K 4.6 09/04/2014 1336   CL 105 06/09/2013 1024   CO2 27 09/04/2014 1336   GLUCOSE 125 09/04/2014 1336   GLUCOSE 107 (H) 06/09/2013 1024   BUN 15.5 09/04/2014 1336   CREATININE 0.8 09/04/2014 1336   CALCIUM 10.0 09/04/2014 1336   PROT 7.6 09/04/2014 1336   ALBUMIN 4.2 09/04/2014 1336   AST 21 09/04/2014 1336   ALT 26 09/04/2014 1336   ALKPHOS 80 09/04/2014 1336   BILITOT 1.66 (H) 09/04/2014 1336   GFRNONAA >60 01/28/2011 1047   GFRAA  01/28/2011 1047    >60        The eGFR has been calculated using the MDRD equation. This calculation has not been validated in all clinical situations. eGFR's persistently <60 mL/min signify possible Chronic Kidney Disease.         RADIOGRAPHY: as above     IMPRESSION/PLAN: Left Breast Cancer   It was a pleasure meeting the patient today. We discussed the risks, benefits, and side effects of radiotherapy. I recommend radiotherapy to the left breast to reduce her risk of locoregional recurrence by 2/3.  We discussed that radiation would take approximately 4 weeks to complete and that I would give the patient a few weeks to heal following surgery before starting treatment planning.  We spoke about acute effects including skin irritation and fatigue as well as much less common late effects including internal organ injury or irritation. We spoke about the latest technology that is used to minimize the  risk of late effects for patients undergoing radiotherapy to the breast or chest wall. No guarantees of treatment were given. The patient is enthusiastic about proceeding with treatment. I look forward to participating in the patient's care.  I will await her referral back to me for postoperative follow-up and eventual CT simulation/treatment planning.  We did discuss the data regarding elderly women with estrogen receptor positive stage I breast cancer, who were randomized after lumpectomy to adjuvant tamoxifen versus tamoxifen plus radiotherapy.  She understands that based on the study radiotherapy would not increase her life expectancy but it would provide a modest benefit in terms of local control to her breast.  I think that radiotherapy is more potentially valuable to her than the typical patient that was treated on the study because she developed an estrogen sensitive breast cancer approximately 1  year after completing several years of antiestrogen therapy.  I do have some concerns that she may yield less long-term control from antiestrogens than we would hope; nevertheless, it is very appropriate for her to strongly consider antiestrogen therapy again with medical oncology and she will see them tomorrow.  She is enthusiastic about proceeding with radiotherapy and I look back forward to seeing her when she is released by her surgeon.  This encounter was provided by telemedicine platform by telephone as patient was unable to access MyChart video during pandemic precautions  The patient has given verbal consent for this type of encounter and has been advised to only accept a meeting of this type in a secure network environment. The time spent during this encounter on date of service, in total, was 30 minutes. The attendants for this meeting include Eppie Gibson  and Mack Guise.  During the encounter, Eppie Gibson was located at [facility] Radiation Oncology Department.    Mack Guise was located at home.    __________________________________________   Eppie Gibson, MD   This document serves as a record of services personally performed by Eppie Gibson, MD. It was created on her behalf by Wilburn Mylar, a trained medical scribe. The creation of this record is based on the scribe's personal observations and the provider's statements to them. This document has been checked and approved by the attending provider.

## 2020-01-18 ENCOUNTER — Other Ambulatory Visit: Payer: Self-pay

## 2020-01-18 ENCOUNTER — Encounter: Payer: Self-pay | Admitting: Radiation Oncology

## 2020-01-18 ENCOUNTER — Ambulatory Visit
Admission: RE | Admit: 2020-01-18 | Discharge: 2020-01-18 | Disposition: A | Discharge status: Home / Self Care | Payer: Medicare Other | Source: Ambulatory Visit | Attending: Hematology and Oncology | Admitting: Hematology and Oncology

## 2020-01-18 ENCOUNTER — Ambulatory Visit
Admission: RE | Admit: 2020-01-18 | Discharge: 2020-01-18 | Disposition: A | Discharge status: Home / Self Care | Payer: Medicare Other | Source: Ambulatory Visit | Attending: Radiation Oncology | Admitting: Radiation Oncology

## 2020-01-18 DIAGNOSIS — C50512 Malignant neoplasm of lower-outer quadrant of left female breast: Secondary | ICD-10-CM | POA: Diagnosis not present

## 2020-01-18 DIAGNOSIS — Z170 Estrogen receptor positive status [ER+]: Secondary | ICD-10-CM

## 2020-01-18 DIAGNOSIS — Z853 Personal history of malignant neoplasm of breast: Secondary | ICD-10-CM | POA: Diagnosis not present

## 2020-01-18 NOTE — Progress Notes (Signed)
Patient Care Team: Crist Infante, MD as PCP - General (Internal Medicine) Lorelle Gibbs, MD as Consulting Physician (Radiology) Romine, Lubertha South, MD as Consulting Physician (Obstetrics and Gynecology) Crissie Reese, MD as Consulting Physician (Plastic Surgery)  DIAGNOSIS:    ICD-10-CM   1. Malignant neoplasm of lower-inner quadrant of right breast of female, estrogen receptor positive ([place])  C50.311    Z17.0   2. Malignant neoplasm of lower-outer quadrant of left breast of female, estrogen receptor positive ([place])  C50.512    Z17.0     SUMMARY OF ONCOLOGIC HISTORY: Oncology History  Breast cancer of lower-inner quadrant of right female breast ([place])  12/31/2010 Initial Diagnosis   Invasive ductal carcinoma grade 1 ER 90% PR 96% Ki-67 11% HER-2 negative ratio 1.08   01/30/2011 Surgery   Right breast mastectomy: No invasive cancer, high-grade DCIS with comedonecrosis 3 lymph nodes negative: Followed by immediate reconstruction with implant   05/29/2011 - 06/25/2018 Anti-estrogen oral therapy   Aromasin 25 mg daily, due to cost, changed to Arimidex 1 mg daily 12/04/2014   Malignant neoplasm of lower-outer quadrant of left female breast ([place])  01/02/2020 Initial Diagnosis   Mammogram showed a 1.0cm indeterminate mass in the left breast. Biopsy showed IDC, grade 2, HER-2 equivocal by IHC, negative by FISH, ER+ 100%, PR+ 5%, Ki67 5%   01/02/2020 Cancer Staging   Staging form: Breast, AJCC 8th Edition - Clinical stage from 01/02/2020: Stage IA (cT1b, cN0, cM0, G2, ER+, PR+, HER2-) - Signed by Eppie Gibson, MD on 01/18/2020     CHIEF COMPLIANT: History of right breast cancer, newly diagnosed left breast cancer  INTERVAL HISTORY: Breanna Neal is a 74 y.o. with above-mentioned history of right breast cancer treated with mastectomy and 7 years of anti-estrogen therapy with Arimidex who is currently on surveillance. Mammogram on 12/20/19 showed a 1.0cm indeterminate mass in the left  breast. Biopsy on 01/02/20 showed invasive ductal carcinoma, grade 2, HER-2 equivocal by IHC, negative by FISH, ER+ 100%, PR+ 5%, Ki67 5%. She presents to the clinic today for follow-up to discuss treatment options.    ALLERGIES:  has No Known Allergies.  MEDICATIONS:  Current Outpatient Medications  Medication Sig Dispense Refill   aspirin 81 MG chewable tablet Chew 81 mg by mouth 2 (two) times a week.      atorvastatin (LIPITOR) 80 MG tablet Take 40 mg by mouth daily.      Calcium Carbonate-Vitamin D (CALCIUM 600 + D PO) Take by mouth daily.      Cholecalciferol (VITAMIN D) 1000 UNITS capsule Take 5,000 Units by mouth. Every other week     fexofenadine (ALLEGRA) 30 MG tablet Take 30 mg by mouth as needed.      Omega-3 Fatty Acids (OMEGA-3 FISH OIL) 1200 MG CAPS Take 2 capsules by mouth daily.      TRIMETHOPRIM PO Take 100 mg by mouth as needed.      valsartan (DIOVAN) 160 MG tablet Take 1 tablet (160 mg total) by mouth daily.     No current facility-administered medications for this visit.    PHYSICAL EXAMINATION: ECOG PERFORMANCE STATUS: 1 - Symptomatic but completely ambulatory  Vitals:   01/19/20 1503  BP: (!) 167/82  Pulse: 76  Resp: 18  Temp: 97.9 F (36.6 C)  SpO2: 97%   Filed Weights   01/19/20 1503  Weight: 159 lb (72.1 kg)      LABORATORY DATA:  I have reviewed the data as listed CMP Latest Ref  Rng & Units 09/04/2014 12/01/2013 06/09/2013  Glucose 70 - 140 mg/dl 125 114 107(H)  BUN 7.0 - 26.0 mg/dL 15.5 11.7 11.4  Creatinine 0.6 - 1.1 mg/dL 0.8 0.8 0.7  Sodium 136 - 145 mEq/L 141 141 140  Potassium 3.5 - 5.1 mEq/L 4.6 4.0 4.3  Chloride 98 - 107 mEq/L - - 105  CO2 22 - 29 mEq/L _0 Calcium 8.4 - 10.4 mg/dL 10.0 10.2 9.9  Total Protein 6.4 - 8.3 g/dL 7.6 7.4 7.5  Total Bilirubin 0.20 - 1.20 mg/dL 1.66(H) 1.59(H) 1.93(H)  Alkaline Phos 40 - 150 U/L 80 79 79  AST 5 - 34 U/L _1 ALT 0 - 55 U/L 26 43 29    Lab Results  Component Value Date    WBC 8.9 09/04/2014   HGB 16.4 (H) 09/04/2014   HCT 49.3 (H) 09/04/2014   MCV 89.7 09/04/2014   PLT 224 09/04/2014   NEUTROABS 5.6 09/04/2014    ASSESSMENT & PLAN:  Breast cancer of lower-inner quadrant of right female breast Stage I invasive ductal carcinoma with DCIS right breast: Initial biopsy showed invasive ductal carcinoma of the final mastectomy only showed DCIS ER/PR positive HER-2 negative: Switched from Aromasin to anastrozole December 2015.  Completed 06/25/2018     Malignant neoplasm of lower-outer quadrant of left female breast ([place]) Mammogram done at [facility]: 12/27/2019: 1 cm lobulated mass with indistinct margin in the left breast lower outer quadrant Ultrasound-guided biopsy: Grade 2 IDC ER 100%, PR 5%, Ki-67 5%, HER-2 negative ratio 1.19  Pathology and radiology counseling: Discussed with the patient, the details of pathology including the type of breast cancer,the clinical staging, the significance of ER, PR and HER-2/neu receptors and the implications for treatment. After reviewing the pathology in detail, we proceeded to discuss the different treatment options between surgery, radiation,   antiestrogen therapies. T1BN0 stage Ia  Treatment plan: 1. Left lumpectomy 2. Followed by adj XRT 3. Followed by adjuvant antiestrogen therapy  It is extremely surprising that she developed breast cancer so soon after stopping antiestrogen therapy. I will see her back after surgery to discuss pathology results.    No orders of the defined types were placed in this encounter.  The patient has a good understanding of the overall plan. she agrees with it. she will call with any problems that may develop before the next visit here.  Total time spent: 30 mins including face to face time and time spent for planning, charting and coordination of care  Nicholas Lose, MD 01/19/2020  I, Cloyde Reams Dorshimer, am acting as scribe for Dr. Nicholas Lose.  I have reviewed the above  documentation for accuracy and completeness, and I agree with the above.

## 2020-01-19 ENCOUNTER — Other Ambulatory Visit: Payer: Self-pay

## 2020-01-19 ENCOUNTER — Inpatient Hospital Stay: Payer: Medicare Other | Attending: Hematology and Oncology | Admitting: Hematology and Oncology

## 2020-01-19 ENCOUNTER — Encounter: Payer: Self-pay | Admitting: *Deleted

## 2020-01-19 ENCOUNTER — Telehealth: Payer: Self-pay | Admitting: Hematology and Oncology

## 2020-01-19 DIAGNOSIS — Z9011 Acquired absence of right breast and nipple: Secondary | ICD-10-CM | POA: Insufficient documentation

## 2020-01-19 DIAGNOSIS — C50512 Malignant neoplasm of lower-outer quadrant of left female breast: Secondary | ICD-10-CM | POA: Diagnosis not present

## 2020-01-19 DIAGNOSIS — Z853 Personal history of malignant neoplasm of breast: Secondary | ICD-10-CM | POA: Insufficient documentation

## 2020-01-19 DIAGNOSIS — Z170 Estrogen receptor positive status [ER+]: Secondary | ICD-10-CM | POA: Diagnosis not present

## 2020-01-19 DIAGNOSIS — C50311 Malignant neoplasm of lower-inner quadrant of right female breast: Secondary | ICD-10-CM | POA: Diagnosis not present

## 2020-01-19 NOTE — Assessment & Plan Note (Signed)
Mammogram done at [facility]: 12/27/2019: 1 cm lobulated mass with indistinct margin in the left breast lower outer quadrant Ultrasound-guided biopsy: Grade 2 IDC ER 100%, PR 5%, Ki-67 5%, HER-2 negative ratio 1.19  Pathology and radiology counseling: Discussed with the patient, the details of pathology including the type of breast cancer,the clinical staging, the significance of ER, PR and HER-2/neu receptors and the implications for treatment. After reviewing the pathology in detail, we proceeded to discuss the different treatment options between surgery, radiation,   antiestrogen therapies. T1BN0 stage Ia  Treatment plan: 1.  Left mastectomy 2.  Follow-up adjuvant antiestrogen therapy  It is extremely surprising that she developed breast cancer so soon after stopping antiestrogen therapy. I will see her back after surgery to discuss pathology results.

## 2020-01-19 NOTE — Telephone Encounter (Signed)
A message was left regarding 2/16

## 2020-01-19 NOTE — Assessment & Plan Note (Addendum)
Stage I invasive ductal carcinoma with DCIS right breast: Initial biopsy showed invasive ductal carcinoma of the final mastectomy only showed DCIS ER/PR positive HER-2 negative: Switched from Aromasin to anastrozole December 2015.Completed 06/25/2018 

## 2020-01-23 ENCOUNTER — Encounter (HOSPITAL_BASED_OUTPATIENT_CLINIC_OR_DEPARTMENT_OTHER): Payer: Self-pay | Admitting: Surgery

## 2020-01-23 ENCOUNTER — Other Ambulatory Visit: Payer: Self-pay

## 2020-01-24 ENCOUNTER — Telehealth: Payer: Self-pay | Admitting: Hematology and Oncology

## 2020-01-24 NOTE — Telephone Encounter (Signed)
Rescheduled per 2/1 sch msg, pt req. Called and spoke with pt, confirmed 2/22 appt

## 2020-01-26 ENCOUNTER — Other Ambulatory Visit: Payer: Self-pay

## 2020-01-26 ENCOUNTER — Other Ambulatory Visit (HOSPITAL_COMMUNITY)
Admission: RE | Admit: 2020-01-26 | Discharge: 2020-01-26 | Disposition: A | Discharge status: Home / Self Care | Payer: Medicare Other | Source: Ambulatory Visit | Attending: Surgery | Admitting: Surgery

## 2020-01-26 ENCOUNTER — Encounter (HOSPITAL_BASED_OUTPATIENT_CLINIC_OR_DEPARTMENT_OTHER)
Admission: RE | Admit: 2020-01-26 | Discharge: 2020-01-26 | Disposition: A | Discharge status: Home / Self Care | Payer: Medicare Other | Source: Ambulatory Visit | Attending: Surgery | Admitting: Surgery

## 2020-01-26 DIAGNOSIS — Z20822 Contact with and (suspected) exposure to covid-19: Secondary | ICD-10-CM | POA: Diagnosis not present

## 2020-01-26 DIAGNOSIS — Z01818 Encounter for other preprocedural examination: Secondary | ICD-10-CM | POA: Insufficient documentation

## 2020-01-26 LAB — SARS CORONAVIRUS 2 (TAT 6-24 HRS): SARS Coronavirus 2: NEGATIVE

## 2020-01-26 NOTE — Progress Notes (Signed)
EKG reviewed by Dr. Odonno, will proceed with surgery as scheduled. 

## 2020-01-26 NOTE — Progress Notes (Signed)

## 2020-01-30 ENCOUNTER — Ambulatory Visit (HOSPITAL_BASED_OUTPATIENT_CLINIC_OR_DEPARTMENT_OTHER)
Admission: RE | Admit: 2020-01-30 | Discharge: 2020-01-30 | Disposition: A | Discharge status: Home / Self Care | Payer: Medicare Other | Attending: General Surgery | Admitting: General Surgery

## 2020-01-30 ENCOUNTER — Telehealth: Payer: Medicare Other | Admitting: Genetic Counselor

## 2020-01-30 ENCOUNTER — Encounter (HOSPITAL_BASED_OUTPATIENT_CLINIC_OR_DEPARTMENT_OTHER)
Admission: RE | Disposition: A | Discharge status: Home / Self Care | Payer: Self-pay | Source: Home / Self Care | Attending: General Surgery

## 2020-01-30 ENCOUNTER — Encounter (HOSPITAL_COMMUNITY)
Admission: RE | Admit: 2020-01-30 | Discharge: 2020-01-30 | Disposition: A | Discharge status: Home / Self Care | Payer: Medicare Other | Source: Ambulatory Visit | Attending: Surgery | Admitting: Surgery

## 2020-01-30 ENCOUNTER — Ambulatory Visit (HOSPITAL_BASED_OUTPATIENT_CLINIC_OR_DEPARTMENT_OTHER): Payer: Medicare Other | Admitting: Certified Registered Nurse Anesthetist

## 2020-01-30 ENCOUNTER — Encounter (HOSPITAL_BASED_OUTPATIENT_CLINIC_OR_DEPARTMENT_OTHER): Payer: Self-pay | Admitting: General Surgery

## 2020-01-30 DIAGNOSIS — C50512 Malignant neoplasm of lower-outer quadrant of left female breast: Secondary | ICD-10-CM | POA: Diagnosis not present

## 2020-01-30 DIAGNOSIS — E7800 Pure hypercholesterolemia, unspecified: Secondary | ICD-10-CM | POA: Diagnosis not present

## 2020-01-30 DIAGNOSIS — E119 Type 2 diabetes mellitus without complications: Secondary | ICD-10-CM | POA: Diagnosis not present

## 2020-01-30 DIAGNOSIS — C50312 Malignant neoplasm of lower-inner quadrant of left female breast: Secondary | ICD-10-CM | POA: Diagnosis not present

## 2020-01-30 DIAGNOSIS — D0512 Intraductal carcinoma in situ of left breast: Secondary | ICD-10-CM | POA: Insufficient documentation

## 2020-01-30 DIAGNOSIS — Z87891 Personal history of nicotine dependence: Secondary | ICD-10-CM | POA: Diagnosis not present

## 2020-01-30 DIAGNOSIS — K529 Noninfective gastroenteritis and colitis, unspecified: Secondary | ICD-10-CM | POA: Diagnosis not present

## 2020-01-30 DIAGNOSIS — C50912 Malignant neoplasm of unspecified site of left female breast: Secondary | ICD-10-CM | POA: Diagnosis not present

## 2020-01-30 DIAGNOSIS — Z9011 Acquired absence of right breast and nipple: Secondary | ICD-10-CM | POA: Diagnosis not present

## 2020-01-30 DIAGNOSIS — G8918 Other acute postprocedural pain: Secondary | ICD-10-CM | POA: Diagnosis not present

## 2020-01-30 DIAGNOSIS — Z8249 Family history of ischemic heart disease and other diseases of the circulatory system: Secondary | ICD-10-CM | POA: Insufficient documentation

## 2020-01-30 DIAGNOSIS — R011 Cardiac murmur, unspecified: Secondary | ICD-10-CM | POA: Diagnosis not present

## 2020-01-30 DIAGNOSIS — I10 Essential (primary) hypertension: Secondary | ICD-10-CM | POA: Insufficient documentation

## 2020-01-30 HISTORY — PX: BREAST LUMPECTOMY WITH RADIOACTIVE SEED AND SENTINEL LYMPH NODE BIOPSY: SHX6550

## 2020-01-30 SURGERY — BREAST LUMPECTOMY WITH RADIOACTIVE SEED AND SENTINEL LYMPH NODE BIOPSY
Anesthesia: Regional | Site: Breast | Laterality: Left

## 2020-01-30 MED ORDER — MIDAZOLAM HCL 2 MG/2ML IJ SOLN
INTRAMUSCULAR | Status: AC
  Filled 2020-01-30: qty 2

## 2020-01-30 MED ORDER — ONDANSETRON HCL 4 MG PO TABS: 4.0000 mg | ORAL_TABLET | Freq: Three times a day (TID) | ORAL | 1 refills | Status: DC | PRN

## 2020-01-30 MED ORDER — BUPIVACAINE HCL (PF) 0.5 % IJ SOLN
INTRAMUSCULAR | Status: AC
  Filled 2020-01-30: qty 30

## 2020-01-30 MED ORDER — TECHNETIUM TC 99M SULFUR COLLOID FILTERED
1.0000 | Freq: Once | INTRAVENOUS | Status: AC | PRN
  Administered 2020-01-30: 1 via INTRADERMAL

## 2020-01-30 MED ORDER — ACETAMINOPHEN 500 MG PO TABS
ORAL_TABLET | ORAL | Status: AC
  Filled 2020-01-30: qty 2

## 2020-01-30 MED ORDER — CHLORHEXIDINE GLUCONATE CLOTH 2 % EX PADS: 6.0000 | MEDICATED_PAD | Freq: Once | CUTANEOUS | Status: DC

## 2020-01-30 MED ORDER — OXYCODONE HCL 5 MG PO TABS: 5.0000 mg | ORAL_TABLET | Freq: Four times a day (QID) | ORAL | 0 refills | Status: DC | PRN

## 2020-01-30 MED ORDER — FENTANYL CITRATE (PF) 100 MCG/2ML IJ SOLN
INTRAMUSCULAR | Status: AC
  Filled 2020-01-30: qty 2

## 2020-01-30 MED ORDER — ONDANSETRON HCL 4 MG/2ML IJ SOLN
INTRAMUSCULAR | Status: DC | PRN
  Administered 2020-01-30: 4 mg via INTRAVENOUS

## 2020-01-30 MED ORDER — EPHEDRINE SULFATE-NACL 50-0.9 MG/10ML-% IV SOSY
PREFILLED_SYRINGE | INTRAVENOUS | Status: DC | PRN
  Administered 2020-01-30: 10 mg via INTRAVENOUS
  Administered 2020-01-30: 5 mg via INTRAVENOUS
  Administered 2020-01-30: 15 mg via INTRAVENOUS

## 2020-01-30 MED ORDER — BUPIVACAINE HCL (PF) 0.25 % IJ SOLN
INTRAMUSCULAR | Status: DC | PRN
  Administered 2020-01-30: 4 mL

## 2020-01-30 MED ORDER — LIDOCAINE 2% (20 MG/ML) 5 ML SYRINGE
INTRAMUSCULAR | Status: DC | PRN
  Administered 2020-01-30: 60 mg via INTRAVENOUS

## 2020-01-30 MED ORDER — GABAPENTIN 300 MG PO CAPS
ORAL_CAPSULE | ORAL | Status: AC
  Filled 2020-01-30: qty 1

## 2020-01-30 MED ORDER — EPHEDRINE 5 MG/ML INJ
INTRAVENOUS | Status: AC
  Filled 2020-01-30: qty 10

## 2020-01-30 MED ORDER — PROPOFOL 10 MG/ML IV BOLUS
INTRAVENOUS | Status: DC | PRN
  Administered 2020-01-30: 150 mg via INTRAVENOUS

## 2020-01-30 MED ORDER — FENTANYL CITRATE (PF) 100 MCG/2ML IJ SOLN
INTRAMUSCULAR | Status: DC | PRN
  Administered 2020-01-30 (×4): 25 ug via INTRAVENOUS

## 2020-01-30 MED ORDER — FENTANYL CITRATE (PF) 100 MCG/2ML IJ SOLN
25.0000 ug | INTRAMUSCULAR | Status: DC | PRN
  Administered 2020-01-30: 14:00:00 25 ug via INTRAVENOUS

## 2020-01-30 MED ORDER — DEXAMETHASONE SODIUM PHOSPHATE 10 MG/ML IJ SOLN
INTRAMUSCULAR | Status: DC | PRN
  Administered 2020-01-30: 5 mg

## 2020-01-30 MED ORDER — CEFAZOLIN SODIUM-DEXTROSE 2-4 GM/100ML-% IV SOLN
2.0000 g | INTRAVENOUS | Status: AC
  Administered 2020-01-30: 12:00:00 2 g via INTRAVENOUS

## 2020-01-30 MED ORDER — FENTANYL CITRATE (PF) 100 MCG/2ML IJ SOLN: 25.0000 ug | INTRAMUSCULAR | Status: DC | PRN

## 2020-01-30 MED ORDER — ACETAMINOPHEN 500 MG PO TABS
1000.0000 mg | ORAL_TABLET | ORAL | Status: AC
  Administered 2020-01-30: 11:00:00 1000 mg via ORAL

## 2020-01-30 MED ORDER — GABAPENTIN 300 MG PO CAPS
300.0000 mg | ORAL_CAPSULE | ORAL | Status: AC
  Administered 2020-01-30: 11:00:00 300 mg via ORAL

## 2020-01-30 MED ORDER — LACTATED RINGERS IV SOLN: INTRAVENOUS | Status: DC

## 2020-01-30 MED ORDER — ROPIVACAINE HCL 5 MG/ML IJ SOLN
INTRAMUSCULAR | Status: DC | PRN
  Administered 2020-01-30: 30 mL via PERINEURAL

## 2020-01-30 MED ORDER — DEXAMETHASONE SODIUM PHOSPHATE 10 MG/ML IJ SOLN
INTRAMUSCULAR | Status: DC | PRN
  Administered 2020-01-30: 10 mg via INTRAVENOUS

## 2020-01-30 MED ORDER — METHYLENE BLUE 0.5 % INJ SOLN
INTRAVENOUS | Status: AC
  Filled 2020-01-30: qty 10

## 2020-01-30 MED ORDER — MIDAZOLAM HCL 2 MG/2ML IJ SOLN
1.0000 mg | INTRAMUSCULAR | Status: DC | PRN
  Administered 2020-01-30: 12:00:00 1 mg via INTRAVENOUS

## 2020-01-30 MED ORDER — FENTANYL CITRATE (PF) 100 MCG/2ML IJ SOLN
50.0000 ug | INTRAMUSCULAR | Status: DC | PRN
  Administered 2020-01-30: 50 ug via INTRAVENOUS

## 2020-01-30 MED ORDER — CEFAZOLIN SODIUM-DEXTROSE 2-4 GM/100ML-% IV SOLN
INTRAVENOUS | Status: AC
  Filled 2020-01-30: qty 100

## 2020-01-30 MED ORDER — SODIUM CHLORIDE (PF) 0.9 % IJ SOLN
INTRAMUSCULAR | Status: AC
  Filled 2020-01-30: qty 10

## 2020-01-30 SURGICAL SUPPLY — 59 items
APPLIER CLIP 9.375 MED OPEN (MISCELLANEOUS)
BINDER BREAST LRG (GAUZE/BANDAGES/DRESSINGS) IMPLANT
BINDER BREAST MEDIUM (GAUZE/BANDAGES/DRESSINGS) IMPLANT
BINDER BREAST XLRG (GAUZE/BANDAGES/DRESSINGS) IMPLANT
BINDER BREAST XXLRG (GAUZE/BANDAGES/DRESSINGS) IMPLANT
BLADE SURG 15 STRL LF DISP TIS (BLADE) ×1 IMPLANT
BLADE SURG 15 STRL SS (BLADE) ×2
CANISTER SUC SOCK COL 7IN (MISCELLANEOUS) IMPLANT
CANISTER SUCT 1200ML W/VALVE (MISCELLANEOUS) IMPLANT
CHLORAPREP W/TINT 26 (MISCELLANEOUS) ×3 IMPLANT
CLIP APPLIE 9.375 MED OPEN (MISCELLANEOUS) IMPLANT
CLIP VESOCCLUDE SM WIDE 6/CT (CLIP) IMPLANT
CLOSURE WOUND 1/2 X4 (GAUZE/BANDAGES/DRESSINGS) ×1
COVER BACK TABLE 60X90IN (DRAPES) ×3 IMPLANT
COVER MAYO STAND STRL (DRAPES) ×3 IMPLANT
COVER PROBE W GEL 5X96 (DRAPES) ×3 IMPLANT
COVER WAND RF STERILE (DRAPES) IMPLANT
DECANTER SPIKE VIAL GLASS SM (MISCELLANEOUS) IMPLANT
DERMABOND ADVANCED (GAUZE/BANDAGES/DRESSINGS) ×2
DERMABOND ADVANCED .7 DNX12 (GAUZE/BANDAGES/DRESSINGS) ×1 IMPLANT
DRAPE LAPAROSCOPIC ABDOMINAL (DRAPES) ×3 IMPLANT
DRAPE UTILITY XL STRL (DRAPES) ×3 IMPLANT
ELECT COATED BLADE 2.86 ST (ELECTRODE) ×3 IMPLANT
ELECT REM PT RETURN 9FT ADLT (ELECTROSURGICAL) ×3
ELECTRODE REM PT RTRN 9FT ADLT (ELECTROSURGICAL) ×1 IMPLANT
GLOVE BIO SURGEON STRL SZ7 (GLOVE) ×6 IMPLANT
GLOVE BIOGEL PI IND STRL 7.5 (GLOVE) ×1 IMPLANT
GLOVE BIOGEL PI INDICATOR 7.5 (GLOVE) ×2
GOWN STRL REUS W/ TWL LRG LVL3 (GOWN DISPOSABLE) ×2 IMPLANT
GOWN STRL REUS W/TWL LRG LVL3 (GOWN DISPOSABLE) ×4
HEMOSTAT ARISTA ABSORB 3G PWDR (HEMOSTASIS) IMPLANT
KIT MARKER MARGIN INK (KITS) ×3 IMPLANT
NDL SAFETY ECLIPSE 18X1.5 (NEEDLE) IMPLANT
NEEDLE HYPO 18GX1.5 SHARP (NEEDLE)
NEEDLE HYPO 25X1 1.5 SAFETY (NEEDLE) ×3 IMPLANT
NS IRRIG 1000ML POUR BTL (IV SOLUTION) IMPLANT
PACK BASIN DAY SURGERY FS (CUSTOM PROCEDURE TRAY) ×3 IMPLANT
PENCIL SMOKE EVACUATOR (MISCELLANEOUS) ×3 IMPLANT
RETRACTOR ONETRAX LX 90X20 (MISCELLANEOUS) ×3 IMPLANT
SLEEVE SCD COMPRESS KNEE MED (MISCELLANEOUS) ×3 IMPLANT
SPONGE LAP 4X18 RFD (DISPOSABLE) IMPLANT
STRIP CLOSURE SKIN 1/2X4 (GAUZE/BANDAGES/DRESSINGS) ×2 IMPLANT
SUT ETHILON 2 0 FS 18 (SUTURE) IMPLANT
SUT MNCRL AB 4-0 PS2 18 (SUTURE) ×6 IMPLANT
SUT MON AB 5-0 PS2 18 (SUTURE) IMPLANT
SUT SILK 2 0 PERMA HAND 18 BK (SUTURE) ×6 IMPLANT
SUT SILK 2 0 SH (SUTURE) IMPLANT
SUT VIC AB 2-0 SH 27 (SUTURE) ×6
SUT VIC AB 2-0 SH 27XBRD (SUTURE) ×3 IMPLANT
SUT VIC AB 3-0 SH 27 (SUTURE) ×4
SUT VIC AB 3-0 SH 27X BRD (SUTURE) ×2 IMPLANT
SUT VIC AB 5-0 PS2 18 (SUTURE) IMPLANT
SUT VICRYL 3-0 CR8 SH (SUTURE) ×3 IMPLANT
SYR CONTROL 10ML LL (SYRINGE) ×3 IMPLANT
TOWEL GREEN STERILE FF (TOWEL DISPOSABLE) ×3 IMPLANT
TRAY FAXITRON CT DISP (TRAY / TRAY PROCEDURE) ×3 IMPLANT
TUBE CONNECTING 20'X1/4 (TUBING)
TUBE CONNECTING 20X1/4 (TUBING) IMPLANT
YANKAUER SUCT BULB TIP NO VENT (SUCTIONS) IMPLANT

## 2020-01-30 NOTE — Anesthesia Preprocedure Evaluation (Addendum)
Anesthesia Evaluation  Patient identified by MRN, date of birth, ID band Patient awake    Reviewed: Allergy & Precautions, NPO status , Patient's Chart, lab work & pertinent test results  Airway Mallampati: II  TM Distance: >3 FB Neck ROM: Full    Dental no notable dental hx. (+) Teeth Intact, Dental Advisory Given   Pulmonary neg pulmonary ROS, former smoker,    Pulmonary exam normal breath sounds clear to auscultation       Cardiovascular hypertension, Pt. on medications negative cardio ROS Normal cardiovascular exam Rhythm:Regular Rate:Normal     Neuro/Psych negative neurological ROS  negative psych ROS   GI/Hepatic negative GI ROS, Neg liver ROS,   Endo/Other  negative endocrine ROSdiabetes  Renal/GU negative Renal ROS  negative genitourinary   Musculoskeletal negative musculoskeletal ROS (+)   Abdominal   Peds  Hematology negative hematology ROS (+)   Anesthesia Other Findings Left breast cancer  Reproductive/Obstetrics                            Anesthesia Physical Anesthesia Plan  ASA: II  Anesthesia Plan: General and Regional   Post-op Pain Management:  Regional for Post-op pain   Induction: Intravenous  PONV Risk Score and Plan: 3 and Ondansetron, Dexamethasone and Midazolam  Airway Management Planned: LMA  Additional Equipment:   Intra-op Plan:   Post-operative Plan: Extubation in OR  Informed Consent: I have reviewed the patients History and Physical, chart, labs and discussed the procedure including the risks, benefits and alternatives for the proposed anesthesia with the patient or authorized representative who has indicated his/her understanding and acceptance.     Dental advisory given  Plan Discussed with: CRNA  Anesthesia Plan Comments:         Anesthesia Quick Evaluation

## 2020-01-30 NOTE — Anesthesia Procedure Notes (Signed)
Anesthesia Regional Block: Pectoralis block   Pre-Anesthetic Checklist: ,, timeout performed, Correct Patient, Correct Site, Correct Laterality, Correct Procedure, Correct Position, site marked, Risks and benefits discussed,  Surgical consent,  Pre-op evaluation,  At surgeon's request and post-op pain management  Laterality: Left  Prep: Maximum Sterile Barrier Precautions used, chloraprep       Needles:  Injection technique: Single-shot  Needle Type: Echogenic Stimulator Needle     Needle Length: 9cm  Needle Gauge: 22     Additional Needles:   Procedures:,,,, ultrasound used (permanent image in chart),,,,  Narrative:  Start time: 01/30/2020 11:47 AM End time: 01/30/2020 11:57 AM Injection made incrementally with aspirations every 5 mL.  Performed by: Personally  Anesthesiologist: Freddrick March, MD  Additional Notes: Monitors applied. No increased pain on injection. No increased resistance to injection. Injection made in 5cc increments. Good needle visualization. Patient tolerated procedure well.

## 2020-01-30 NOTE — H&P (Signed)
Mack Guise Documented: 01/12/2020 7:58 AM Location: [facility] Patient #: [id number] DOB: 02/08/1946 Married / Language: English / Race: White Female   History of Present Illness  The patient is a 74 year old female who presents with a complaint of breast cancer.  The PCP is Dr. Jerilynn Mages. Perini The patient was referred by Dr. Loni Muse. Luan Pulling She is with her husband, Clair Gulling.  [The Covid-19 virus has disrupted normal medical care in [place] and across the nation. We have sometimes had to alter normal surgical/medical care to limit this epidemic and we have explained these changes to the patient.]  I took care of her for right breast cancer in 2012. She has done well from that cancer. I last saw her in 2012. She went for recent mammogram [facility] and they found a new mass in the inferior aspect of her left breast.  Mammograms: [facility], 12/27/2019 - 1.0 cm oval mass in the LOQ of the left breast Biopsy: Left breast, 6 o'clock, 01/02/2020 ([id number]) - IDC, grade 2, ER - 100%, PR - 5%, Ki67 - 5%, Her2Neu - equivocal Family history of breast or ovarian cancer: No On hormone therapy: No  I discussed the options for breast cancer treatment with the patient. The patient understands that breast cancer is managed in a multidisciplinary effort, which includes medical oncology and radiation oncology. I discussed the surgical options of lumpectomy vs. mastectomy. If mastectomy, there is the possibility of reconstruction. I discussed the options of lymph node biopsy. The treatment plan depends on the pathologic staging of the tumor and the patient's personal wishes. The risks of surgery include, but are not limited to, bleeding, infection, the need for further surgery, and nerve injury. The patient has been given literature on the treatment of breast cancer.  Plan: 1. Left breast lumpectomy (seed localization) with left axillary  sentinel lymph node biopsy, 2. Sees Dr. Lindi Adie next week, 3. Rad Oncology consult, 4. Genetics  Review of Systems as stated in this history (HPI) or in the review of systems. Otherwise all other 12 point ROS are negative  Past Medical History: 1. Left breast cancer 2. Right breast cancer, T1a, N0 Right mastectomy - 01/30/2011 Right breast reconstruction with implant Harlow Mares - 08/19/2011 Saw Dr. Chancy Milroy at that time - now sees Dr. Lindi Adie She took anastrazole for 7 years 3. HTN 4. Heart murmur Saw Dr. [place] - no further eval 5. Colonoscopy by Dr. Paulita Fujita - 2019 6. Hysterectomy,BSO - 2004 7. Lap chole - 1999 - [facility]  Social History: Husband, Clair Gulling. She has one son who is 6 yo.  The patient's family history was non contributory.  DATA REVIEWED, COUNSELING AND COORDINATION OF CARE: I have personally seen and evaluated the patient, evaluated laboratory and imaging results, formulated the assessment and plan and placed orders. This requires high medical decision making. Total time spent with patient and charting: 50 minutes               Past Surgical History Andreas Blower, [place]; 01/12/2020 8:53 AM) Breast Biopsy  Bilateral. Breast Reconstruction  Right. Gallbladder Surgery - Laparoscopic  Hysterectomy (not due to cancer) - Complete  Mastectomy  Right.  Diagnostic Studies History Andreas Blower, [place]; 01/12/2020 8:53 AM) Colonoscopy  within last year Mammogram  within last year Pap Smear  >5 years ago  Allergies Andreas [place]lower, [place]; 01/12/2020 8:54 AM) No Known Drug Allergies [01/12/2020]:  Medication History (Armen Ferguson, CMA; 01/12/2020 8:56 AM) Valsartan (160MG Tablet, Oral) Active. Atorvastatin Calcium (80MG  Tablet, Oral) Active. Allegra (30MG Tablet, Oral) Active. Trimethoprim (100MG Tablet, Oral) Active. Fish Oil (1200MG Capsule, Oral) Active. Aspirin (81MG Tablet, Oral) Active. Calcium 600 (1500 (600  Ca)MG Tablet, Oral) Active. Vitamin D (1000UNIT Tablet, Oral) Active. Medications Reconciled  Social History Andreas Blower, [place]; 01/12/2020 8:53 AM) Alcohol use  Occasional alcohol use. Caffeine use  Coffee. No drug use  Tobacco use  Former smoker.  Family History Andreas Blower, [place]; 01/12/2020 8:53 AM) Colon Cancer  Mother. Heart Disease  Father. Hypertension  Father, Mother, Sister. Melanoma  Mother. Migraine Headache  Sister. Ovarian Cancer  Sister.  Pregnancy / Birth History Andreas Blower, [place]; 01/12/2020 8:53 AM) Age at menarche  82 years. Age of menopause  44-50 Contraceptive History  Oral contraceptives. Gravida  2 Length (months) of breastfeeding  3-6 Maternal age  10-35 Para  1  Other Problems (Armen Glo Herring, [place]; 01/12/2020 8:53 AM) Breast Cancer  Cholelithiasis  Diabetes Mellitus  Heart murmur  High blood pressure  Hypercholesterolemia  Lump In Breast  Oophorectomy     Review of Systems (Armen Ferguson CMA; 01/12/2020 8:53 AM) General Not Present- Appetite Loss, Chills, Fatigue, Fever, Night Sweats, Weight Gain and Weight Loss. Skin Not Present- Change in Wart/Mole, Dryness, Hives, Jaundice, New Lesions, Non-Healing Wounds, Rash and Ulcer. HEENT Present- Wears glasses/contact lenses. Not Present- Earache, Hearing Loss, Hoarseness, Nose Bleed, Oral Ulcers, Ringing in the Ears, Seasonal Allergies, Sinus Pain, Sore Throat, Visual Disturbances and Yellow Eyes. Respiratory Not Present- Bloody sputum, Chronic Cough, Difficulty Breathing, Snoring and Wheezing. Breast Present- Breast Mass. Not Present- Breast Pain, Nipple Discharge and Skin Changes. Cardiovascular Not Present- Chest Pain, Difficulty Breathing Lying Down, Leg Cramps, Palpitations, Rapid Heart Rate, Shortness of Breath and Swelling of Extremities. Gastrointestinal Present- Chronic diarrhea. Not Present- Abdominal Pain, Bloating, Bloody Stool, Change in Bowel Habits,  Constipation, Difficulty Swallowing, Excessive gas, Gets full quickly at meals, Hemorrhoids, Indigestion, Nausea, Rectal Pain and Vomiting. Female Genitourinary Not Present- Frequency, Nocturia, Painful Urination, Pelvic Pain and Urgency. Musculoskeletal Not Present- Back Pain, Joint Pain, Joint Stiffness, Muscle Pain, Muscle Weakness and Swelling of Extremities. Neurological Not Present- Decreased Memory, Fainting, Headaches, Numbness, Seizures, Tingling, Tremor, Trouble walking and Weakness. Psychiatric Not Present- Anxiety, Bipolar, Change in Sleep Pattern, Depression, Fearful and Frequent crying. Endocrine Not Present- Cold Intolerance, Excessive Hunger, Hair Changes, Heat Intolerance, Hot flashes and New Diabetes. Hematology Not Present- Blood Thinners, Easy Bruising, Excessive bleeding, Gland problems, HIV and Persistent Infections.  Vitals (Armen Ferguson CMA; 01/12/2020 8:54 AM) 01/12/2020 8:53 AM Weight: 160.25 lb Height: 64in Body Surface Area: 1.78 m Body Mass Index: 27.51 kg/m  Temp.: 67F  Pulse: 94 (Regular)  P.OX: 95% (Room air) BP: 146/98 (Sitting, Left Arm, Standard)       Physical Exam  The physical exam findings are as follows: Note:General: WN WF who is alert and generally healthy appearing. She is wearing a mask. Skin: Inspection and palpation of the skin unremarkable.  Eyes: Conjunctivae white, pupils equal. Face, ears, nose, mouth, and throat: Face - wore a mask  Neck: Supple. No mass. Trachea midline. No thyroid mass. Lymph Nodes: No supraclavicular or cervical adenopathy. No axillary adenopathy.  Lungs: Normal respiratory effort. Clear to auscultation and symmetric breath sounds. Cardiovascular: Regular rate and rhythm. Normal auscultation of the heart. 2/6 systolic heart murmur  Breasts: Right - reconstructed breast. no mass or nodule Left - bruise at 6 o'clock, no mass  Abdomen: Soft. No mass. Liver and spleen not palpable. No tenderness.  No hernia. Normal bowel  sounds. Rectal: Not done.  Musculoskeletal/extremities: Normal gait. Good strength and ROM in upper and lower extremities.  Neurologic: Grossly intact to motor and sensory function. Psychiatric: Has normal mood and affect. Judgement and insight appear normal.    Assessment & Plan   MALIGNANT NEOPLASM OF LEFT BREAST, STAGE 1, ESTROGEN RECEPTOR POSITIVE (C50.912)  Story: Left breast, 6 o'clock, 01/02/2020 ([id number]) - IDC, grade 2, ER - 100%, PR - 5%, Ki67 - 5%, Her2Neu - equivocal Impression: Plan:  1. Left breast lumpectomy (seed localization) with left axillary sentinel lymph node biopsy  2. Sees Dr. Lindi Adie next week  3. Rad Oncology consult  4. Genetics HISTORY OF RIGHT BREAST CANCER (Z85.3) Story: Right breast cancer, T1a, N0 (there was an area of invasive ductal ca on her original core biopsy, but the mastectomy had only DCIS) Right mastectomy - 01/30/2011

## 2020-01-30 NOTE — Anesthesia Procedure Notes (Signed)
Procedure Name: LMA Insertion Date/Time: 01/30/2020 12:19 PM Performed by: Genelle Bal, CRNA Pre-anesthesia Checklist: Patient identified, Emergency Drugs available, Suction available and Patient being monitored Patient Re-evaluated:Patient Re-evaluated prior to induction Oxygen Delivery Method: Circle system utilized Preoxygenation: Pre-oxygenation with 100% oxygen Induction Type: IV induction Ventilation: Mask ventilation without difficulty LMA: LMA inserted LMA Size: 4.0 Number of attempts: 1 Airway Equipment and Method: Bite block Placement Confirmation: positive ETCO2 Tube secured with: Tape Dental Injury: Teeth and Oropharynx as per pre-operative assessment

## 2020-01-30 NOTE — Interval H&P Note (Signed)
History and Physical Interval Note:  01/30/2020 11:45 AM Patient of Dr Lucia Gaskins who is unavailable to do surgery today. I discussed this with patient on phone yesterday and she agreed to continue with me.  I have seen her and reviewed all available data. Will proceed with left lump/sn biopsy Breanna Neal  has presented today for surgery, with the diagnosis of LEFT BREAST CANCER.  The various methods of treatment have been discussed with the patient and family. After consideration of risks, benefits and other options for treatment, the patient has consented to  Procedure(s): LEFT BREAST LUMPECTOMY WITH RADIOACTIVE SEED AND LEFT AXILLARY SENTINEL LYMPH NODE BIOPSY (Left) as a surgical intervention.  The patient's history has been reviewed, patient examined, no change in status, stable for surgery.  I have reviewed the patient's chart and labs.  Questions were answered to the patient's satisfaction.     Rolm Bookbinder

## 2020-01-30 NOTE — Op Note (Signed)
Preoperative diagnosis:clinical stage Ileft breast cancer Postoperative diagnosis: Same as above Procedure:Leftbreastseed guided lumpectomy Leftdeep axillary sentinel node biopsy Surgeon: Dr. Serita Grammes Anesthesia: General  Specimens: 1.Leftbreast tissue marked with paint, containing seed and clip 2. Additionalleft breastanterior, superior and lateral marginsmarked short superior, long lateral, double deep 3.Leftdeep axillary nodes with highest count of 105 Estimated blood [id number] cc Complications: None Drains: None Sponge and count was correct at completion Disposition to recovery in stable condition  Indications:73 yo with prior right breast cancer on screening mammogram found to have left liq breast mass on [place] was 1.1 cm. Nodes were negative. This is grade II IDC. She had previously seen Dr Lucia Gaskins who is unable to do surgery today. I discussed with her and we elected to proceed. I had her mammograms in the OR.   Procedure: After informed consent was obtained shewas placed under general anesthesia without complication.She had undergone a pectoral block and injected with technetium in standard periareolar fashion.She was given antibiotics. SCDs were in place. She was then prepped and draped in the standard sterile surgical fashion. A surgical timeout was then performed.  I then identified the seed in theinferomedial portionof the left breast.Imade an inframammary incision to hide the scar later.Iinfiltrated Marcaine.I then tunneled to the seed using the neoprobe and lighted retractor for guidance. I then removed the seed and the surrounding tissue with an attempt to get a clear margin. The seedand clip wereon the specimen mammogram. The seed was close to the margins above.  I did take additional margins as listed above.   I then placed clips in the cavity. I closed this with 2-0 vicryl, 3-0 vicryl and 4-0 monocryl.  I then placed glue and steristrips.   I then located the sentinel nodesin the low axilla. Ienteredthe axillary fascia. I then identified the sentinel node with a count of105. I excised this.There were several other small areas with radioactivity that I removed as well. .The background radioactivity was less than 10% and there were no palpable nodes present.I then obtained hemostasis. I closed the fascia with 2-0 vicryl. I closed this with 2-0 vicryl 3-0 vicryl and 4-0 monocryl.glue and steristrips were applied. She had a binder placed. She was extubated and transferred to recovery stable

## 2020-01-30 NOTE — Anesthesia Postprocedure Evaluation (Signed)
Anesthesia Post Note  Patient: Breanna Neal  Procedure(s) Performed: LEFT BREAST LUMPECTOMY WITH RADIOACTIVE SEED AND LEFT AXILLARY SENTINEL LYMPH NODE BIOPSY (Left Breast)     Patient location during evaluation: PACU Anesthesia Type: General and Regional Level of consciousness: awake and alert Pain management: pain level controlled Vital Signs Assessment: post-procedure vital signs reviewed and stable Respiratory status: spontaneous breathing, nonlabored ventilation, respiratory function stable and patient connected to nasal cannula oxygen Cardiovascular status: blood pressure returned to baseline and stable Postop Assessment: no apparent nausea or vomiting Anesthetic complications: no    Last Vitals:  Vitals:   01/30/20 1400 01/30/20 1412  BP: (!) 149/78   Pulse: 88 95  Resp: 16   Temp:    SpO2: 100% 97%    Last Pain:  Vitals:   01/30/20 1412  TempSrc:   PainSc: 3                  Omaree Fuqua L Andrue Dini

## 2020-01-30 NOTE — Transfer of Care (Signed)
Immediate Anesthesia Transfer of Care Note  Patient: Breanna Neal  Procedure(s) Performed: LEFT BREAST LUMPECTOMY WITH RADIOACTIVE SEED AND LEFT AXILLARY SENTINEL LYMPH NODE BIOPSY (Left Breast)  Patient Location: PACU  Anesthesia Type:GA combined with regional for post-op pain  Level of Consciousness: awake, alert  and oriented  Airway & Oxygen Therapy: Patient Spontanous Breathing and Patient connected to face mask oxygen  Post-op Assessment: Report given to RN and Post -op Vital signs reviewed and stable  Post vital signs: Reviewed and stable  Last Vitals:  Vitals Value Taken Time  BP 156/74 01/30/20 1323  Temp    Pulse 98 01/30/20 1326  Resp 18 01/30/20 1326  SpO2 100 % 01/30/20 1326  Vitals shown include unvalidated device data.  Last Pain:  Vitals:   01/30/20 1113  TempSrc: Temporal  PainSc: 0-No pain         Complications: No apparent anesthesia complications

## 2020-01-30 NOTE — Progress Notes (Signed)
   Assisted Dr. Woodrum with left, ultrasound guided, pectoralis block. Side rails up, monitors on throughout procedure. See vital signs in flow sheet. Tolerated Procedure well. 

## 2020-01-30 NOTE — Discharge Instructions (Signed)
[facility] Office Phone Number [phone]  POST OP INSTRUCTIONS Take 400 mg of ibuprofen every 8 hours or 650 mg tylenol every 6 hours for next 72 hours then as needed. Use ice several times daily also. Always review your discharge instruction sheet given to you by the facility where your surgery was performed.     IF YOU HAVE DISABILITY OR FAMILY LEAVE FORMS, YOU MUST BRING THEM TO THE OFFICE FOR PROCESSING.  DO NOT GIVE THEM TO YOUR DOCTOR.  1. A prescription for pain medication may be given to you upon discharge.  Take your pain medication as prescribed, if needed.  If narcotic pain medicine is not needed, then you may take acetaminophen (Tylenol), naprosyn (Alleve) or ibuprofen (Advil) as needed. 2. Take your usually prescribed medications unless otherwise directed 3. If you need a refill on your pain medication, please contact your pharmacy.  They will contact our office to request authorization.  Prescriptions will not be filled after 5pm or on week-ends. 4. You should eat very light the first 24 hours after surgery, such as soup, crackers, pudding, etc.  Resume your normal diet the day after surgery. 5. Most patients will experience some swelling and bruising in the breast.  Ice packs and a good support bra will help.  Wear the breast binder provided or a sports bra for 72 hours day and night.  After that wear a sports bra during the day until you return to the office. Swelling and bruising can take several days to resolve.  6. It is common to experience some constipation if taking pain medication after surgery.  Increasing fluid intake and taking a stool softener will usually help or prevent this problem from occurring.  A mild laxative (Milk of Magnesia or Miralax) should be taken according to package directions if there are no bowel movements after 48 hours. 7. Unless discharge instructions indicate otherwise, you may remove your bandages 48 hours after surgery and you  may shower at that time.  You may have steri-strips (small skin tapes) in place directly over the incision.  These strips should be left on the skin for 7-10 days and will come off on their own.  If your surgeon used skin glue on the incision, you may shower in 24 hours.  The glue will flake off over the next 2-3 weeks.  Any sutures or staples will be removed at the office during your follow-up visit. 8. ACTIVITIES:  You may resume regular daily activities (gradually increasing) beginning the next day.  Wearing a good support bra or sports bra minimizes pain and swelling.  You may have sexual intercourse when it is comfortable. a. You may drive when you no longer are taking prescription pain medication, you can comfortably wear a seatbelt, and you can safely maneuver your car and apply brakes. b. RETURN TO WORK:  ______________________________________________________________________________________ 9. You should see your doctor in the office for a follow-up appointment approximately two weeks after your surgery.  Your doctor's nurse will typically make your follow-up appointment when she calls you with your pathology report.  Expect your pathology report 3-4 business days after your surgery.  You may call to check if you do not hear from [place] after three days. 10. OTHER INSTRUCTIONS: _______________________________________________________________________________________________ _____________________________________________________________________________________________________________________________________ _____________________________________________________________________________________________________________________________________ _____________________________________________________________________________________________________________________________________  WHEN TO CALL DR WAKEFIELD: 1. Fever over 101.0 2. Nausea and/or vomiting. 3. Extreme swelling or bruising. 4. Continued bleeding from  incision. 5. Increased pain, redness, or drainage from the incision.  The clinic staff  is available to answer your questions during regular business hours.  Please don't hesitate to call and ask to speak to one of the nurses for clinical concerns.  If you have a medical emergency, go to the nearest emergency room or call 911.  A surgeon from [facility] is always on call at the hospital.  For further questions, please visit [email or web address]    NO TYLENOL PRODUCTS UNTIL  5:05 PM     Post Anesthesia Home Care Instructions  Activity: Get plenty of rest for the remainder of the day. A responsible individual must stay with you for 24 hours following the procedure.  For the next 24 hours, DO NOT: -Drive a car -Paediatric nurse -Drink alcoholic beverages -Take any medication unless instructed by your physician -Make any legal decisions or sign important papers.  Meals: Start with liquid foods such as gelatin or soup. Progress to regular foods as tolerated. Avoid greasy, spicy, heavy foods. If nausea and/or vomiting occur, drink only clear liquids until the nausea and/or vomiting subsides. Call your physician if vomiting continues.  Special Instructions/Symptoms: Your throat may feel dry or sore from the anesthesia or the breathing tube placed in your throat during surgery. If this causes discomfort, gargle with warm salt water. The discomfort should disappear within 24 hours.  If you had a scopolamine patch placed behind your ear for the management of post- operative nausea and/or vomiting:  1. The medication in the patch is effective for 72 hours, after which it should be removed.  Wrap patch in a tissue and discard in the trash. Wash hands thoroughly with soap and water. 2. You may remove the patch earlier than 72 hours if you experience unpleasant side effects which may include dry mouth, dizziness or visual disturbances. 3. Avoid touching the patch. Wash  your hands with soap and water after contact with the patch.  No tylenol until after 5pm today.

## 2020-01-31 ENCOUNTER — Encounter: Payer: Self-pay | Admitting: *Deleted

## 2020-02-01 LAB — SURGICAL PATHOLOGY

## 2020-02-03 ENCOUNTER — Ambulatory Visit: Payer: Medicare Other

## 2020-02-06 NOTE — Progress Notes (Signed)
Patient Care Team: Crist Infante, MD as PCP - General (Internal Medicine) Lorelle Gibbs, MD as Consulting Physician (Radiology) Romine, Lubertha South, MD as Consulting Physician (Obstetrics and Gynecology) Crissie Reese, MD as Consulting Physician (Plastic Surgery)  DIAGNOSIS:    ICD-10-CM   1. Malignant neoplasm of lower-inner quadrant of right breast of female, estrogen receptor positive ([place])  C50.311    Z17.0   2. Malignant neoplasm of lower-outer quadrant of left breast of female, estrogen receptor positive ([place])  C50.512    Z17.0     SUMMARY OF ONCOLOGIC HISTORY: Oncology History  Breast cancer of lower-inner quadrant of right female breast ([place])  12/31/2010 Initial Diagnosis   Invasive ductal carcinoma grade 1 ER 90% PR 96% Ki-67 11% HER-2 negative ratio 1.08   01/30/2011 Surgery   Right breast mastectomy: No invasive cancer, high-grade DCIS with comedonecrosis 3 lymph nodes negative: Followed by immediate reconstruction with implant   05/29/2011 - 06/25/2018 Anti-estrogen oral therapy   Aromasin 25 mg daily, due to cost, changed to Arimidex 1 mg daily 12/04/2014   Malignant neoplasm of lower-outer quadrant of left female breast ([place])  01/02/2020 Initial Diagnosis   Mammogram showed a 1.0cm indeterminate mass in the left breast. Biopsy showed IDC, grade 2, HER-2 equivocal by IHC, negative by FISH, ER+ 100%, PR+ 5%, Ki67 5%   01/02/2020 Cancer Staging   Staging form: Breast, AJCC 8th Edition - Clinical stage from 01/02/2020: Stage IA (cT1b, cN0, cM0, G2, ER+, PR+, HER2-) - Signed by Eppie Gibson, MD on 01/18/2020   01/30/2020 Surgery   Left lumpectomy Breanna Neal): IDC, grade 2, 1.3cm, with intermediate grade DCIS, clear margins, 3 left axillary lymph nodes negative     CHIEF COMPLIANT: Follow-up s/p lumpectomy to review pathology   INTERVAL HISTORY: Breanna Neal is a 74 y.o. with above-mentioned history of of right breast cancer and newly diagnosed left breast cancer.  She underwent a left lumpectomy on 01/30/20 with Dr. Donne Neal for which pathology showed invasive ductal carcinoma, grade 2, 1.3cm, with intermediate grade DCIS, clear margins, 3 left axillary lymph nodes negative for carcinoma. She presents to the clinic today to discuss the pathology report and further treatment.   ALLERGIES:  has No Known Allergies.  MEDICATIONS:  Current Outpatient Medications  Medication Sig Dispense Refill  . aspirin 81 MG chewable tablet Chew 81 mg by mouth 2 (two) times a week.     Marland Kitchen atorvastatin (LIPITOR) 80 MG tablet Take 40 mg by mouth daily.     . Calcium Carbonate-Vitamin D (CALCIUM 600 + D PO) Take by mouth daily.     . Cholecalciferol (VITAMIN D) 1000 UNITS capsule Take 5,000 Units by mouth. Every other week    . fexofenadine (ALLEGRA) 30 MG tablet Take 30 mg by mouth as needed.     . Multiple Vitamin (MULTIVITAMIN) capsule Take 1 capsule by mouth daily.    . Omega-3 Fatty Acids (OMEGA-3 FISH OIL) 1200 MG CAPS Take 2 capsules by mouth daily.     . ondansetron (ZOFRAN) 4 MG tablet Take 1 tablet (4 mg total) by mouth every 8 (eight) hours as needed for nausea or vomiting. 10 tablet 1  . oxyCODONE (OXY IR/ROXICODONE) 5 MG immediate release tablet Take 1 tablet (5 mg total) by mouth every 6 (six) hours as needed for moderate pain, severe pain or breakthrough pain. 8 tablet 0  . TRIMETHOPRIM PO Take 100 mg by mouth as needed.     . valsartan (DIOVAN) 160 MG tablet  Take 1 tablet (160 mg total) by mouth daily.     No current facility-administered medications for this visit.    PHYSICAL EXAMINATION: ECOG PERFORMANCE STATUS: 1 - Symptomatic but completely ambulatory  Vitals:   02/07/20 1022  BP: (!) 163/70  Pulse: 84  Resp: 16  Temp: 98.2 F (36.8 C)  SpO2: 98%   Filed Weights   02/07/20 1022  Weight: 157 lb 8 oz (71.4 kg)    LABORATORY DATA:  I have reviewed the data as listed CMP Latest Ref Rng & Units 09/04/2014 12/01/2013 06/09/2013  Glucose 70 - 140  mg/dl 125 114 107(H)  BUN 7.0 - 26.0 mg/dL 15.5 11.7 11.4  Creatinine 0.6 - 1.1 mg/dL 0.8 0.8 0.7  Sodium 136 - 145 mEq/L 141 141 140  Potassium 3.5 - 5.1 mEq/L 4.6 4.0 4.3  Chloride 98 - 107 mEq/L - - 105  CO2 22 - 29 mEq/L '27 24 25  ' Calcium 8.4 - 10.4 mg/dL 10.0 10.2 9.9  Total Protein 6.4 - 8.3 g/dL 7.6 7.4 7.5  Total Bilirubin 0.20 - 1.20 mg/dL 1.66(H) 1.59(H) 1.93(H)  Alkaline Phos 40 - 150 U/L 80 79 79  AST 5 - 34 U/L '21 29 20  ' ALT 0 - 55 U/L 26 43 29    Lab Results  Component Value Date   WBC 8.9 09/04/2014   HGB 16.4 (H) 09/04/2014   HCT 49.3 (H) 09/04/2014   MCV 89.7 09/04/2014   PLT 224 09/04/2014   NEUTROABS 5.6 09/04/2014    ASSESSMENT & PLAN:  Breast cancer of lower-inner quadrant of right female breast Stage I invasive ductal carcinoma with DCIS right breast: Initial biopsy showed invasive ductal carcinoma of the final mastectomy only showed DCIS ER/PR positive HER-2 negative: Switched from Aromasin to anastrozole December 2015.  Completed 06/25/2018  Malignant neoplasm of lower-outer quadrant of left female breast ([place]) Mammogram done at [facility]: 12/27/2019: 1 cm lobulated mass with indistinct margin in the left breast lower outer quadrant Ultrasound-guided biopsy: Grade 2 IDC ER 100%, PR 5%, Ki-67 5%, HER-2 negative ratio 1.19  T1BN0 stage Ia 01/30/20: Left lumpectomy Breanna Neal): IDC, grade 2, 1.3cm, with intermediate grade DCIS, clear margins, 3 left axillary lymph nodes negative  Treatment plan: 1. Followed by adj XRT 2. Followed by adjuvant antiestrogen therapy  No orders of the defined types were placed in this encounter.  The patient has a good understanding of the overall plan. she agrees with it. she will call with any problems that may develop before the next visit here.  Total time spent: 30 mins including face to face time and time spent for planning, charting and coordination of care  Nicholas Lose, MD 02/07/2020  I, Cloyde Reams Dorshimer, am acting as  scribe for Dr. Nicholas Lose.  I have reviewed the above documentation for accuracy and completeness, and I agree with the above.

## 2020-02-07 ENCOUNTER — Other Ambulatory Visit: Payer: Self-pay

## 2020-02-07 ENCOUNTER — Inpatient Hospital Stay: Payer: Medicare Other | Attending: Hematology and Oncology | Admitting: Hematology and Oncology

## 2020-02-07 DIAGNOSIS — C50311 Malignant neoplasm of lower-inner quadrant of right female breast: Secondary | ICD-10-CM

## 2020-02-07 DIAGNOSIS — Z170 Estrogen receptor positive status [ER+]: Secondary | ICD-10-CM | POA: Diagnosis not present

## 2020-02-07 DIAGNOSIS — C50512 Malignant neoplasm of lower-outer quadrant of left female breast: Secondary | ICD-10-CM

## 2020-02-07 DIAGNOSIS — Z86000 Personal history of in-situ neoplasm of breast: Secondary | ICD-10-CM | POA: Insufficient documentation

## 2020-02-07 NOTE — Assessment & Plan Note (Signed)
Stage I invasive ductal carcinoma with DCIS right breast: Initial biopsy showed invasive ductal carcinoma of the final mastectomy only showed DCIS ER/PR positive HER-2 negative: Switched from Aromasin to anastrozole December 2015.Completed 06/25/2018 

## 2020-02-07 NOTE — Assessment & Plan Note (Signed)
Mammogram done at [facility]: 12/27/2019: 1 cm lobulated mass with indistinct margin in the left breast lower outer quadrant Ultrasound-guided biopsy: Grade 2 IDC ER 100%, PR 5%, Ki-67 5%, HER-2 negative ratio 1.19  T1BN0 stage Ia 01/30/20: Left lumpectomy Breanna Neal): IDC, grade 2, 1.3cm, with intermediate grade DCIS, clear margins, 3 left axillary lymph nodes negative  Treatment plan: 1. Followed by adj XRT 2. Followed by adjuvant antiestrogen therapy

## 2020-02-10 ENCOUNTER — Encounter: Payer: Self-pay | Admitting: *Deleted

## 2020-02-13 ENCOUNTER — Inpatient Hospital Stay (HOSPITAL_BASED_OUTPATIENT_CLINIC_OR_DEPARTMENT_OTHER): Payer: Medicare Other | Admitting: Genetic Counselor

## 2020-02-13 ENCOUNTER — Ambulatory Visit: Payer: Medicare Other

## 2020-02-13 ENCOUNTER — Other Ambulatory Visit: Payer: Self-pay | Admitting: Genetic Counselor

## 2020-02-13 ENCOUNTER — Encounter: Payer: Self-pay | Admitting: Genetic Counselor

## 2020-02-13 DIAGNOSIS — Z170 Estrogen receptor positive status [ER+]: Secondary | ICD-10-CM | POA: Diagnosis not present

## 2020-02-13 DIAGNOSIS — D0339 Melanoma in situ of other parts of face: Secondary | ICD-10-CM

## 2020-02-13 DIAGNOSIS — Z803 Family history of malignant neoplasm of breast: Secondary | ICD-10-CM | POA: Diagnosis not present

## 2020-02-13 DIAGNOSIS — Z8041 Family history of malignant neoplasm of ovary: Secondary | ICD-10-CM

## 2020-02-13 DIAGNOSIS — Z800 Family history of malignant neoplasm of digestive organs: Secondary | ICD-10-CM | POA: Insufficient documentation

## 2020-02-13 DIAGNOSIS — C50512 Malignant neoplasm of lower-outer quadrant of left female breast: Secondary | ICD-10-CM

## 2020-02-13 DIAGNOSIS — C50311 Malignant neoplasm of lower-inner quadrant of right female breast: Secondary | ICD-10-CM

## 2020-02-13 NOTE — Progress Notes (Addendum)
REFERRING PROVIDER: Nicholas Lose, MD [place],  [place] [id number]  PRIMARY PROVIDER:  Crist Infante, MD  PRIMARY REASON FOR VISIT:  1. Malignant neoplasm of lower-outer quadrant of left breast of female, estrogen receptor positive ([place])   2. Family history of breast cancer   3. Family history of colon cancer   4. Family history of ovarian cancer   5. Melanoma in situ of cheek ([place])   6. Malignant neoplasm of lower-inner quadrant of right breast of female, estrogen receptor positive ([place])      HISTORY OF PRESENT ILLNESS:   Ms. Gowin, a 74 y.o. female, was seen for a [facility] cancer genetics consultation at the request of Dr. Lindi Adie due to a personal and family history of cancer.  Ms. Shumard presents to clinic today to discuss the possibility of a hereditary predisposition to cancer, genetic testing, and to further clarify her future cancer risks, as well as potential cancer risks for family members.   In 2012, at the age of 56, Ms. Bihl was diagnosed with DCIS of the left breast. The treatment plan included lumpectomy and radiation.  In January 2021, at the age of 65, Ms Lingelbach was diagnosed with invasive ductal carcinoma  Of the right breast.     CANCER HISTORY:  Oncology History  Breast cancer of lower-inner quadrant of right female breast ([place])  12/31/2010 Initial Diagnosis   Invasive ductal carcinoma grade 1 ER 90% PR 96% Ki-67 11% HER-2 negative ratio 1.08   01/30/2011 Surgery   Right breast mastectomy: No invasive cancer, high-grade DCIS with comedonecrosis 3 lymph nodes negative: Followed by immediate reconstruction with implant   05/29/2011 - 06/25/2018 Anti-estrogen oral therapy   Aromasin 25 mg daily, due to cost, changed to Arimidex 1 mg daily 12/04/2014   Malignant neoplasm of lower-outer quadrant of left female breast ([place])  01/02/2020 Initial Diagnosis   Mammogram showed a 1.0cm indeterminate mass in the left breast. Biopsy showed IDC, grade 2,  HER-2 equivocal by IHC, negative by FISH, ER+ 100%, PR+ 5%, Ki67 5%   01/02/2020 Cancer Staging   Staging form: Breast, AJCC 8th Edition - Clinical stage from 01/02/2020: Stage IA (cT1b, cN0, cM0, G2, ER+, PR+, HER2-) - Signed by Eppie Gibson, MD on 01/18/2020   01/30/2020 Surgery   Left lumpectomy Donne Hazel): IDC, grade 2, 1.3cm, with intermediate grade DCIS, clear margins, 3 left axillary lymph nodes negative      RISK FACTORS:  Menarche was at age 80.  First live birth at age 61.  Ovaries intact: no.  Hysterectomy: yes.  Menopausal status: postmenopausal.  HRT use: 10 years. Colonoscopy: yes; normal. Mammogram within the last year: yes. Number of breast biopsies: 3. Up to date with pelvic exams: no. Any excessive radiation exposure in the past: yes  Past Medical History:  Diagnosis Date  . Breast CA ([place])   . Breast cancer ([place]) 01/2011   right  . Controlled diabetes mellitus with microalbuminuria ([place])   . Diabetes mellitus   . Family history of breast cancer   . Family history of colon cancer   . Family history of leukemia   . Family history of ovarian cancer   . Fibroids   . Heart murmur   . History of recurrent UTIs   . Hyperlipidemia   . Hypertension   . Melanoma in situ of cheek ([place])   . Osteopenia     Past Surgical History:  Procedure Laterality Date  . ABDOMINAL HYSTERECTOMY    .  BREAST LUMPECTOMY WITH RADIOACTIVE SEED AND SENTINEL LYMPH NODE BIOPSY Left 01/30/2020   Procedure: LEFT BREAST LUMPECTOMY WITH RADIOACTIVE SEED AND LEFT AXILLARY SENTINEL LYMPH NODE BIOPSY;  Surgeon: Rolm Bookbinder, MD;  Location: [facility];  Service: General;  Laterality: Left;  . CATARACT EXTRACTION Left   . CHOLECYSTECTOMY    . ECTOPIC PREGNANCY SURGERY    . SIMPLE MASTECTOMY  01/28/2011   right side;with reconstruction by Dr Harlow Mares    Social History   Socioeconomic History  . Marital status: Married    Spouse name: Not on file  . Number of  children: 1  . Years of education: Not on file  . Highest education level: Not on file  Occupational History  . Occupation: human resources  Tobacco Use  . Smoking status: Former Research scientist (life sciences)  . Smokeless tobacco: Never Used  . Tobacco comment: she smoked in 1975 for about 2 years  Substance and Sexual Activity  . Alcohol use: Yes    Comment: OCCASIONAL WINE  . Drug use: No  . Sexual activity: Yes  Other Topics Concern  . Not on file  Social History Narrative  . Not on file   Social Determinants of Health   Financial Resource Strain:   . Difficulty of Paying Living Expenses: Not on file  Food Insecurity:   . Worried About Charity fundraiser in the Last Year: Not on file  . Ran Out of Food in the Last Year: Not on file  Transportation Needs:   . Lack of Transportation (Medical): Not on file  . Lack of Transportation (Non-Medical): Not on file  Physical Activity:   . Days of Exercise per Week: Not on file  . Minutes of Exercise per Session: Not on file  Stress:   . Feeling of Stress : Not on file  Social Connections:   . Frequency of Communication with Friends and Family: Not on file  . Frequency of Social Gatherings with Friends and Family: Not on file  . Attends Religious Services: Not on file  . Active Member of Clubs or Organizations: Not on file  . Attends Archivist Meetings: Not on file  . Marital Status: Not on file     FAMILY HISTORY:  We obtained a detailed, 4-generation family history.  Significant diagnoses are listed below: Family History  Problem Relation Age of Onset  . Colon cancer Mother 104  . Heart disease Father   . Dementia Father   . Ovarian cancer Sister 4  . Leukemia Sister 81       Acute leukemia  . Other Maternal Grandmother        died in childbirth  . Healthy Son   . Breast cancer Paternal Aunt        >50  . Breast cancer Cousin 45       maternal cousin    The patient has one son who is cancer free.  She has two sisters, one  who had ovarian cancer at 92 and the other who had acute leukemia at 74 and died from this.  Both parents are deceased.  The patient's father died of old age and dementia.  He had two brothers and two sisters, one sister had breast cancer.  His parents are deceased.  The patient's mother had colon cancer at 70.  She had a full brother who died in childbirth with her mother, and three paternal half sisters.  One sister has a daughter who had breast cancer at 47.  There is no other reported family history of cancer.  Ms. Romo is unaware of previous family history of genetic testing for hereditary cancer risks. Patient's maternal ancestors are of [place] descent, and paternal ancestors are of [place] descent. There is no reported Ashkenazi Jewish ancestry. There is no known consanguinity.    GENETIC COUNSELING ASSESSMENT: Ms. Petitjean is a 74 y.o. female with a personal and family history of cancer which is somewhat suggestive of a hereditary breast and ovarian cancer syndrome and predisposition to cancer given her bilateral breast cancer and the family history of breast and ovarian cancer. We, therefore, discussed and recommended the following at today's visit.   DISCUSSION: We discussed that 5 - 10% of breast cancer is hereditary, with most cases associated with BRCA mutations.  There are other genes that can be associated with hereditary breast cancer syndromes.  These include ATM, CHEK2 and PALB2.   We discussed that ATM has been associated with not only an increased risk for breast and ovarian cancer, but also there is a higher incidence of leukemia in these families. We discussed that testing is beneficial for several reasons including knowing how to follow individuals after completing their treatment, identifying whether potential treatment options such as PARP inhibitors would be beneficial, and understand if other family members could be at risk for cancer and allow them to undergo  genetic testing.   We reviewed the characteristics, features and inheritance patterns of hereditary cancer syndromes. We also discussed genetic testing, including the appropriate family members to test, the process of testing, insurance coverage and turn-around-time for results. We discussed the implications of a negative, positive, carrier and/or variant of uncertain significant result. We recommended Ms. Duecker pursue genetic testing for the common hereditary cancer gene panel. The Common Hereditary Gene Panel offered by [facility] includes sequencing and/or deletion duplication testing of the following 48 genes: APC, ATM, AXIN2, BARD1, BMPR1A, BRCA1, BRCA2, BRIP1, CDH1, CDK4, CDKN2A (p14ARF), CDKN2A (p16INK4a), CHEK2, CTNNA1, DICER1, EPCAM (Deletion/duplication testing only), GREM1 (promoter region deletion/duplication testing only), KIT, MEN1, MLH1, MSH2, MSH3, MSH6, MUTYH, NBN, NF1, NHTL1, PALB2, PDGFRA, PMS2, POLD1, POLE, PTEN, RAD50, RAD51C, RAD51D, RNF43, SDHB, SDHC, SDHD, SMAD4, SMARCA4. STK11, TP53, TSC1, TSC2, and VHL.  The following genes were evaluated for sequence changes only: SDHA and HOXB13 c.251G>A variant only.   Based on Ms. Corrales's personal and family history of cancer, she meets medical criteria for genetic testing. Despite that she meets criteria, she may still have an out of pocket cost. We discussed that if her out of pocket cost for testing is over $100, the laboratory will call and confirm whether she wants to proceed with testing.  If the out of pocket cost of testing is less than $100 she will be billed by the genetic testing laboratory.   PLAN: After considering the risks, benefits, and limitations, Ms. Tortora provided informed consent to pursue genetic testing and the blood sample was sent to [facility] for analysis of the common hereditary cancer panel. Results should be available within approximately 2-3 weeks' time, at which point they will be disclosed by telephone to  Ms. Rosko, as will any additional recommendations warranted by these results. Ms. Goin will receive a summary of her genetic counseling visit and a copy of her results once available. This information will also be available in Epic.   Lastly, we encouraged Ms. Peatross to remain in contact with cancer genetics annually so that we can continuously update the family history and inform her of any  changes in cancer genetics and testing that may be of benefit for this family.   Ms. Brutus questions were answered to her satisfaction today. Our contact information was provided should additional questions or concerns arise. Thank you for the referral and allowing [place] to share in the care of your patient.   Cailen Mihalik P. Florene Glen, [place], [facility] Licensed, Insurance risk surveyor [email or web address] phone: [phone]  The patient was seen for a total of 35 minutes in face-to-face genetic counseling.  This patient was discussed with Drs. Magrinat, Lindi Adie and/or Burr Medico who agrees with the above.    _______________________________________________________________________ For Office Staff:  Number of people involved in session: 1 Was an Intern/ student involved with case: no

## 2020-02-15 NOTE — Progress Notes (Signed)
Location of Breast Cancer: Left Breast  Histology per Pathology Report:  01/02/2020 Diagnosis Breast, left, needle core biopsy, 6 o'clock, 9cmfn - INVASIVE DUCTAL CARCINOMA  Receptor Status: ER(100%), PR (5%), Her2-neu (NEG), Ki-(5%)  01/30/20 FINAL MICROSCOPIC DIAGNOSIS: A. BREAST, LEFT, LUMPECTOMY: - Invasive ductal carcinoma, grade 2, 1.3 cm - Ductal carcinoma in situ, intermediate grade - Carcinoma is 0.5 cm from the posterior margin. See comment - Negative for lymphovascular invasion - Biopsy site changes - See oncology table B. BREAST, LEFT ADDITIONAL SUPERIOR MARGIN, EXCISION: - Benign breast parenchyma, negative for carcinoma C. BREAST, LEFT ADDITIONAL LATERAL MARGIN, EXCISION: - Benign breast parenchyma, negative for carcinoma D. BREAST, LEFT ADDITIONAL ANTERIOR MARGIN, EXCISION: - Benign breast parenchyma, negative for carcinoma E. LYMPH NODE, LEFT AXILLARY, SENTINEL, BIOPSY: - Lymph node, negative for carcinoma (0/1) F. LYMPH NODE, LEFT AXILLARY, SENTINEL, BIOPSY: - Lymph node, negative for carcinoma (0/1) G. LYMPH NODE, LEFT AXILLARY, SENTINEL, BIOPSY: - Lymph node, negative for carcinoma (0/1)  Did patient present with symptoms or was this found on screening mammography?: It was found on a screening mammogram.   Past/Anticipated interventions by surgeon, if any: 01/30/20 Procedure:Leftbreastseed guided lumpectomy Leftdeep axillary sentinel node biopsy Surgeon: Dr. Serita Grammes   Past/Anticipated interventions by medical oncology, if any:  02/07/20 Dr. Lindi Adie ASSESSMENT & PLAN:  Breast cancer of lower-inner quadrant of right female breast Stage I invasive ductal carcinoma with DCIS right breast: Initial biopsy showed invasive ductal carcinoma of the final mastectomy only showed DCIS ER/PR positive HER-2 negative: Switched from Aromasin to anastrozole December 2015.Completed 06/25/2018  Malignant neoplasm of lower-outer quadrant of left female breast  ([place]) [facility]: 12/27/2019: 1 cm lobulated mass with indistinct margin in the leftbreast lower outer quadrant Ultrasound-guided biopsy: Grade 2 IDC ER 100%, PR 5%, Ki-67 5%, HER-2 negative ratio 1.19  T1BN0 stage Ia 01/30/20: Left lumpectomy Donne Hazel): IDC, grade 2, 1.3cm, with intermediate grade DCIS, clear margins, 3 left axillary lymph nodes negative  Treatment plan: 1. Followed by adj XRT 2.Followed byadjuvant antiestrogen therapy   Lymphedema issues, if any:  She denies. She has good arm mobility.   Pain issues, if any:  She has soreness at her surgery site.   SAFETY ISSUES:  Prior radiation? No  Pacemaker/ICD? No  Possible current pregnancy? No  Is the patient on methotrexate? No  Current Complaints / other details:    BP (!) 169/73 (BP Location: Right Arm, Patient Position: Sitting, Cuff Size: Normal)   Pulse 73   Temp 98.5 F (36.9 C)   Resp 18   Wt 159 lb 12.8 oz (72.5 kg)   SpO2 99%   BMI 27.43 kg/m    Wt Readings from Last 3 Encounters:  02/20/20 159 lb 12.8 oz (72.5 kg)  02/07/20 157 lb 8 oz (71.4 kg)  01/30/20 156 lb 12 oz (71.1 kg)

## 2020-02-16 ENCOUNTER — Ambulatory Visit: Payer: Medicare Other | Attending: Internal Medicine

## 2020-02-16 DIAGNOSIS — Z23 Encounter for immunization: Secondary | ICD-10-CM | POA: Insufficient documentation

## 2020-02-16 NOTE — Progress Notes (Signed)
   Covid-19 Vaccination Clinic  Name:  Breanna Neal    MRN: [id number] DOB: Aug 15, 1946  02/16/2020  Breanna Neal was observed post Covid-19 immunization for 15 minutes without incidence. She was provided with Vaccine Information Sheet and instruction to access the V-Safe system.   Breanna Neal was instructed to call 911 with any severe reactions post vaccine: Marland Kitchen Difficulty breathing  . Swelling of your face and throat  . A fast heartbeat  . A bad rash all over your body  . Dizziness and weakness    Immunizations Administered    Name Date Dose VIS Date Route   Pfizer COVID-19 Vaccine 02/16/2020  9:26 AM 0.3 mL 12/02/2019 Intramuscular   Manufacturer: [place]   Lot: [id number]   [place]: [id number]

## 2020-02-20 ENCOUNTER — Inpatient Hospital Stay: Payer: Medicare Other | Attending: Hematology and Oncology

## 2020-02-20 ENCOUNTER — Other Ambulatory Visit: Payer: Self-pay

## 2020-02-20 ENCOUNTER — Telehealth: Payer: Self-pay | Admitting: Hematology and Oncology

## 2020-02-20 ENCOUNTER — Ambulatory Visit
Admission: RE | Admit: 2020-02-20 | Discharge: 2020-02-20 | Disposition: A | Discharge status: Home / Self Care | Payer: Medicare Other | Source: Ambulatory Visit | Attending: Radiation Oncology | Admitting: Radiation Oncology

## 2020-02-20 ENCOUNTER — Encounter: Payer: Self-pay | Admitting: Radiation Oncology

## 2020-02-20 DIAGNOSIS — Z79899 Other long term (current) drug therapy: Secondary | ICD-10-CM | POA: Diagnosis not present

## 2020-02-20 DIAGNOSIS — Z510 Encounter for antineoplastic radiation therapy: Secondary | ICD-10-CM | POA: Diagnosis not present

## 2020-02-20 DIAGNOSIS — C50512 Malignant neoplasm of lower-outer quadrant of left female breast: Secondary | ICD-10-CM | POA: Diagnosis not present

## 2020-02-20 DIAGNOSIS — Z7982 Long term (current) use of aspirin: Secondary | ICD-10-CM | POA: Insufficient documentation

## 2020-02-20 DIAGNOSIS — Z800 Family history of malignant neoplasm of digestive organs: Secondary | ICD-10-CM | POA: Diagnosis not present

## 2020-02-20 DIAGNOSIS — Z9889 Other specified postprocedural states: Secondary | ICD-10-CM | POA: Diagnosis not present

## 2020-02-20 DIAGNOSIS — Z8041 Family history of malignant neoplasm of ovary: Secondary | ICD-10-CM | POA: Diagnosis not present

## 2020-02-20 DIAGNOSIS — Z803 Family history of malignant neoplasm of breast: Secondary | ICD-10-CM | POA: Diagnosis not present

## 2020-02-20 DIAGNOSIS — Z170 Estrogen receptor positive status [ER+]: Secondary | ICD-10-CM

## 2020-02-20 DIAGNOSIS — C50311 Malignant neoplasm of lower-inner quadrant of right female breast: Secondary | ICD-10-CM

## 2020-02-20 NOTE — Telephone Encounter (Signed)
Scheduled fu per 3/1 sch msg. Left voicemail with new appt details. Mailed reminder letter and calender.

## 2020-02-21 ENCOUNTER — Encounter: Payer: Self-pay | Admitting: Radiation Oncology

## 2020-02-21 DIAGNOSIS — C50512 Malignant neoplasm of lower-outer quadrant of left female breast: Secondary | ICD-10-CM | POA: Diagnosis not present

## 2020-02-21 DIAGNOSIS — Z510 Encounter for antineoplastic radiation therapy: Secondary | ICD-10-CM | POA: Diagnosis not present

## 2020-02-21 LAB — GENETIC SCREENING ORDER

## 2020-02-21 NOTE — Progress Notes (Signed)
Radiation Oncology         [phone] ________________________________  Name: Breanna Neal MRN: [id number]  Date: 02/20/2020  DOB: 1946-06-19  Follow-Up Visit Note  Outpatient  CC: Crist Infante, MD  Magrinat, Virgie Dad, MD  Diagnosis:      ICD-10-CM   1. Malignant neoplasm of lower-outer quadrant of left breast of female, estrogen receptor positive ([place])  C50.512    Z17.0    Cancer Staging Breast cancer of lower-inner quadrant of right female breast ([place]) Staging form: Breast, AJCC 7th Edition - Clinical: No stage assigned - Unsigned - Pathologic: Stage IA (T1a, N0, cM0) - Signed by Rulon Eisenmenger, MD on 09/04/2014  Malignant neoplasm of lower-outer quadrant of left female breast [facility]) Staging form: Breast, AJCC 8th Edition - Clinical stage from 01/02/2020: Stage IA (cT1b, cN0, cM0, G2, ER+, PR+, HER2-) - Signed by Eppie Gibson, MD on 01/18/2020 - Pathologic stage from 02/21/2020: Stage IA (pT1c, pN0, cM0, G2, ER+, PR+, HER2-) - Signed by Eppie Gibson, MD on 02/21/2020   CHIEF COMPLAINT: Here to discuss management of left breast cancer  Narrative:  The patient returns today for follow-up.     Since consultation date, she underwent left lumpectomy and sentinel lymph node biopsy on 01/30/2020.  This revealed a 1.3 cm tumor, invasive ductal carcinoma, with DCIS.  The margins are negative.  The 3 sentinel nodes are negative.  She saw medical oncology and has plans to follow radiotherapy with adjuvant  antiestrogen therapy  She denies any lymphedema.  She has good mobility of her arms.  She has some soreness at her surgical site          ALLERGIES:  has No Known Allergies.  Meds: Current Outpatient Medications  Medication Sig Dispense Refill  . aspirin 81 MG chewable tablet Chew 81 mg by mouth 2 (two) times a week.     Marland Kitchen atorvastatin (LIPITOR) 80 MG tablet Take 40 mg by mouth daily.     . Calcium Carbonate-Vitamin D (CALCIUM 600 + D PO) Take by mouth daily.     .  Cholecalciferol (VITAMIN D) 1000 UNITS capsule Take 5,000 Units by mouth. Every other week    . fexofenadine (ALLEGRA) 30 MG tablet Take 30 mg by mouth as needed.     . Multiple Vitamin (MULTIVITAMIN) capsule Take 1 capsule by mouth daily.    . Omega-3 Fatty Acids (OMEGA-3 FISH OIL) 1200 MG CAPS Take 2 capsules by mouth daily.     Marland Kitchen TRIMETHOPRIM PO Take 100 mg by mouth as needed.     . valsartan (DIOVAN) 160 MG tablet Take 1 tablet (160 mg total) by mouth daily.    . ondansetron (ZOFRAN) 4 MG tablet Take 1 tablet (4 mg total) by mouth every 8 (eight) hours as needed for nausea or vomiting. (Patient not taking: Reported on 02/20/2020) 10 tablet 1  . oxyCODONE (OXY IR/ROXICODONE) 5 MG immediate release tablet Take 1 tablet (5 mg total) by mouth every 6 (six) hours as needed for moderate pain, severe pain or breakthrough pain. (Patient not taking: Reported on 02/20/2020) 8 tablet 0   No current facility-administered medications for this encounter.    Physical Findings:  weight is 159 lb 12.8 oz (72.5 kg). Her temperature is 98.5 F (36.9 C). Her blood pressure is 169/73 (abnormal) and her pulse is 73. Her respiration is 18 and oxygen saturation is 99%. .     General: Alert and oriented, in no acute distress Psychiatric: Judgment and  insight are intact. Affect is appropriate. Breast exam reveals satisfactory healing of axillary scar and inframammary fold scar, left breast  Lab Findings: Lab Results  Component Value Date   WBC 8.9 09/04/2014   HGB 16.4 (H) 09/04/2014   HCT 49.3 (H) 09/04/2014   MCV 89.7 09/04/2014   PLT 224 09/04/2014      Radiographic Findings: NM Sentinel Node Inj-No Rpt (Breast)  Result Date: 01/30/2020 Sulfur colloid was injected by the nuclear medicine technologist for melanoma sentinel node.    Impression/Plan: We discussed adjuvant radiotherapy today.  I recommend 4 weeks of radiotherapy to the left breast in order to reduce risk of local regional recurrence by two  thirds.  She understands that radiotherapy would not improve her life expectancy but would provide a modest benefit in terms of increasing her chance of local control.  The alternative would be to proceed with antiestrogen therapy alone.  She and I agree that it is best to err on the side of caution and proceed with radiotherapy since she developed a cancer soon after completing a  course of antiestrogens previously. She would like to be aggressive with her postoperative management and is enthusiastic to proceed.  The risks, benefits and side effects of this treatment were discussed in detail.  She understands that radiotherapy is associated with skin irritation and fatigue in the acute setting. Late effects can include cosmetic changes and rare injury to internal organs.   She is enthusiastic about proceeding with treatment. A consent form has been  signed and placed in her chart.  We will proceed with simulation today.  On date of service, in total, I spent 25 minutes on this encounter _____________________________________   Eppie Gibson, MD

## 2020-02-27 ENCOUNTER — Ambulatory Visit
Admission: RE | Admit: 2020-02-27 | Discharge: 2020-02-27 | Disposition: A | Discharge status: Home / Self Care | Payer: Medicare Other | Source: Ambulatory Visit | Attending: Radiation Oncology | Admitting: Radiation Oncology

## 2020-02-27 ENCOUNTER — Other Ambulatory Visit: Payer: Self-pay

## 2020-02-27 ENCOUNTER — Ambulatory Visit (HOSPITAL_COMMUNITY): Payer: Medicare Other

## 2020-02-27 DIAGNOSIS — C50311 Malignant neoplasm of lower-inner quadrant of right female breast: Secondary | ICD-10-CM

## 2020-02-27 DIAGNOSIS — Z170 Estrogen receptor positive status [ER+]: Secondary | ICD-10-CM

## 2020-02-27 DIAGNOSIS — C50512 Malignant neoplasm of lower-outer quadrant of left female breast: Secondary | ICD-10-CM | POA: Diagnosis not present

## 2020-02-27 DIAGNOSIS — Z510 Encounter for antineoplastic radiation therapy: Secondary | ICD-10-CM | POA: Diagnosis not present

## 2020-02-27 MED ORDER — SONAFINE EX EMUL
1.0000 "application " | Freq: Once | CUTANEOUS | Status: AC
  Administered 2020-02-27: 1 via TOPICAL

## 2020-02-27 MED ORDER — ALRA NON-METALLIC DEODORANT (RAD-ONC)
1.0000 "application " | Freq: Once | TOPICAL | Status: AC
  Administered 2020-02-27: 1 via TOPICAL

## 2020-02-27 NOTE — Progress Notes (Signed)

## 2020-02-28 ENCOUNTER — Other Ambulatory Visit: Payer: Self-pay

## 2020-02-28 ENCOUNTER — Ambulatory Visit
Admission: RE | Admit: 2020-02-28 | Discharge: 2020-02-28 | Disposition: A | Discharge status: Home / Self Care | Payer: Medicare Other | Source: Ambulatory Visit | Attending: Radiation Oncology | Admitting: Radiation Oncology

## 2020-02-28 DIAGNOSIS — Z510 Encounter for antineoplastic radiation therapy: Secondary | ICD-10-CM | POA: Diagnosis not present

## 2020-02-28 DIAGNOSIS — C50512 Malignant neoplasm of lower-outer quadrant of left female breast: Secondary | ICD-10-CM | POA: Diagnosis not present

## 2020-02-29 ENCOUNTER — Ambulatory Visit
Admission: RE | Admit: 2020-02-29 | Discharge: 2020-02-29 | Disposition: A | Discharge status: Home / Self Care | Payer: Medicare Other | Source: Ambulatory Visit | Attending: Radiation Oncology | Admitting: Radiation Oncology

## 2020-02-29 ENCOUNTER — Other Ambulatory Visit: Payer: Self-pay

## 2020-02-29 DIAGNOSIS — C50512 Malignant neoplasm of lower-outer quadrant of left female breast: Secondary | ICD-10-CM | POA: Diagnosis not present

## 2020-02-29 DIAGNOSIS — Z510 Encounter for antineoplastic radiation therapy: Secondary | ICD-10-CM | POA: Diagnosis not present

## 2020-03-01 ENCOUNTER — Ambulatory Visit
Admission: RE | Admit: 2020-03-01 | Discharge: 2020-03-01 | Disposition: A | Discharge status: Home / Self Care | Payer: Medicare Other | Source: Ambulatory Visit | Attending: Radiation Oncology | Admitting: Radiation Oncology

## 2020-03-01 ENCOUNTER — Other Ambulatory Visit: Payer: Self-pay

## 2020-03-01 ENCOUNTER — Ambulatory Visit: Payer: Medicare Other | Attending: General Surgery

## 2020-03-01 DIAGNOSIS — C50512 Malignant neoplasm of lower-outer quadrant of left female breast: Secondary | ICD-10-CM | POA: Diagnosis not present

## 2020-03-01 DIAGNOSIS — Z510 Encounter for antineoplastic radiation therapy: Secondary | ICD-10-CM | POA: Diagnosis not present

## 2020-03-01 DIAGNOSIS — Z170 Estrogen receptor positive status [ER+]: Secondary | ICD-10-CM | POA: Diagnosis not present

## 2020-03-01 DIAGNOSIS — I890 Lymphedema, not elsewhere classified: Secondary | ICD-10-CM

## 2020-03-01 NOTE — Therapy (Signed)
[place], [place], [address] Phone: [phone]   Fax:  [phone]  Physical Therapy Evaluation  Patient Details  Name: Breanna Neal MRN: [id number] Date of Birth: [birth year] Referring Provider (PT): Rolm Bookbinder MD   Encounter Date: 03/01/2020  PT End of Session - 03/01/20 1050    Visit Number  1    Number of Visits  1    Date for PT Re-Evaluation  03/08/20    PT Start Time  1006    PT Stop Time  1045    PT Time Calculation (min)  39 min    Activity Tolerance  Patient tolerated treatment well    Behavior During Therapy  [facility] for tasks assessed/performed       Past Medical History:  Diagnosis Date  . Breast CA ([place])   . Breast cancer ([place]) 01/2011   right  . Controlled diabetes mellitus with microalbuminuria ([place])   . Diabetes mellitus   . Family history of breast cancer   . Family history of colon cancer   . Family history of leukemia   . Family history of ovarian cancer   . Fibroids   . Heart murmur   . History of recurrent UTIs   . Hyperlipidemia   . Hypertension   . Melanoma in situ of cheek ([place])   . Osteopenia     Past Surgical History:  Procedure Laterality Date  . ABDOMINAL HYSTERECTOMY    . BREAST LUMPECTOMY WITH RADIOACTIVE SEED AND SENTINEL LYMPH NODE BIOPSY Left 01/30/2020   Procedure: LEFT BREAST LUMPECTOMY WITH RADIOACTIVE SEED AND LEFT AXILLARY SENTINEL LYMPH NODE BIOPSY;  Surgeon: Rolm Bookbinder, MD;  Location: [place];  Service: General;  Laterality: Left;  . CATARACT EXTRACTION Left   . CHOLECYSTECTOMY    . ECTOPIC PREGNANCY SURGERY    . SIMPLE MASTECTOMY  01/28/2011   right side;with reconstruction by Dr Harlow Mares    There were no vitals filed for this visit.   Subjective Assessment - 03/01/20 1010    Subjective  Pt reports that she had a R masectomy in 2011 with reconstructive with a saline implant without chemotherapy or  radiation. She had a lumpectomy with SLNB on 01/30/2020 and is on her 4th day of 20 for ratiation therapy. She does not have to take chemotherapy. Pt reports that after her masectomy she had frozen shoulder and had to have physical therapy but currently that shoulder is doing great. She states that she currently has no heaviness or loss of ROM in her LUE.    Pertinent History  Invasive ductal carcinoma grade 1 ER 90% PR 96% Ki-67 11% HER-2 negative ratio 1.08 on the R with with masectomy and 3 lymph node removal all negative, L breast IDC, grade 2, ER+, PR+, HER2-, clear margins, 3 left axillary lymph nodes negative.    How long can you sit comfortably?  No difficulty    How long can you stand comfortably?  No difficulty    How long can you walk comfortably?  No difficulty    Patient Stated Goals  I want to learn what I can do to prevent swelling in my arm.    Currently in Pain?  No/denies         [facility] PT Assessment - 03/01/20 0001      Assessment   Medical Diagnosis  L breast cancer     Referring Provider (PT)  Rolm Bookbinder MD    Onset Date/Surgical Date  01/30/20    Hand Dominance  Left    Prior Therapy  On the R shoulder      Precautions   Precautions  Other (comment)   re-ocurring cancer     Balance Screen   Has the patient fallen in the past 6 months  No    Has the patient had a decrease in activity level because of a fear of falling?   Yes    Is the patient reluctant to leave their home because of a fear of falling?   No      Home Environment   Living Environment  Private residence    Living Arrangements  Spouse/significant other    Type of [place] Access  Stairs to enter    Entrance Stairs-Number of Steps  [place]  One level      Prior Function   Level of [place]  Retired    Leisure  Manufacturing engineer   Overall Cognitive Status  Within Functional Limits for tasks assessed       Posture/Postural Control   Posture/Postural Control  No significant limitations      ROM / Strength   AROM / PROM / Strength  AROM      AROM   AROM Assessment Site  Shoulder    Right/Left Shoulder  Right;Left    Right Shoulder Flexion  147 Degrees    Right Shoulder ABduction  114 Degrees    Right Shoulder Internal Rotation  76 Degrees    Right Shoulder External Rotation  74 Degrees    Left Shoulder Flexion  156 Degrees    Left Shoulder ABduction  155 Degrees    Left Shoulder Internal Rotation  72 Degrees    Left Shoulder External Rotation  80 Degrees        LYMPHEDEMA/ONCOLOGY QUESTIONNAIRE - 03/01/20 1031      Type   Cancer Type  DCIS      Surgeries   Mastectomy Date  01/30/11    Lumpectomy Date  01/30/20    Sentinel Lymph Node Biopsy Date  01/30/20   01/30/2011   Number Lymph Nodes Removed  3   on each side      Treatment   Active Chemotherapy Treatment  No    Past Chemotherapy Treatment  No    Active Radiation Treatment  Yes    Date  03/01/20    Body Site  L breast and axilla    Past Radiation Treatment  No    Current Hormone Treatment  No    Past Hormone Therapy  Yes    Drug Name  Aromasin      What other symptoms do you have   Are you Having Heaviness or Tightness  No    Are you having Pain  No    Are you having pitting edema  No    Is it Hard or Difficult finding clothes that fit  No    Do you have infections  No    Is there Decreased scar mobility  No      Lymphedema Assessments   Lymphedema Assessments  Upper extremities      Right Upper Extremity Lymphedema   15 cm Proximal to Olecranon Process  27.8 cm    10 cm Proximal to Olecranon Process  27 cm    Olecranon Process  24.5 cm    15 cm Proximal to Ulnar Styloid Process  23.5 cm    10 cm Proximal to Ulnar Styloid Process  20.4 cm    Just Proximal to Ulnar Styloid Process  15.9 cm    Across Hand at [employer]  18.5 cm    At [address] of 2nd Digit  6.5 cm      Left Upper Extremity  Lymphedema   15 cm Proximal to Olecranon Process  28.5 cm    10 cm Proximal to Olecranon Process  26.5 cm    Olecranon Process  23.8 cm    15 cm Proximal to Ulnar Styloid Process  22.7 cm    10 cm Proximal to Ulnar Styloid Process  20 cm    Just Proximal to Ulnar Styloid Process  16.5 cm    Across Hand at [employer]  18.2 cm    At [place] of 2nd Digit  6.4 cm             Objective measurements completed on examination: See above findings.              PT Education - 03/01/20 1043    Education Details  Pt was educated on the risk of occurance of lymphedema following radiation therapy and 3 lymph node removal. Discussed risk reduction practices and pt was educated on the anatomy/physiology of the lymphatic system. She was educated on reasons to return to physical therapy if she notices edema in her LUE including heaviness, pain, edema in the hand or occurance of infections.    Person(s) Educated  Patient    Methods  Explanation    Comprehension  Verbalized understanding       PT Short Term Goals - 03/01/20 1056      PT SHORT TERM GOAL #1   Title  Pt will be able to recognize signs/symptoms of increased fluid in the LUE.    Baseline  Pt is unaware of signs/symptoms.    Time  1    Period  Days    Status  Achieved    Target Date  03/01/20      PT SHORT TERM GOAL #2   Title  Pt will be aware of risk reduction precautions for lymphedema within 1 visit.    Baseline  Pt is unaware of risk reduction precautions.    Time  1    Period  Days    Status  Achieved    Target Date  03/01/20                Plan - 03/01/20 1053    Clinical Impression Statement  Pt presents to physical therapy following L lumpectomy with 3 lymph node removal and has just started radiation therapy. She previously had a masectomy with 3 lymph node removal and no radiation therapy back in 2012. Pt currently has no significant difference in circumferential measurements of the BIl UE. She  does have decreased abduction in the RUE but has a history of frozen shoulder in the R shoulder. Pt will start to perform her exercises she is performing on the LUE for the RUE. She was educated on her risk for lymphedema along with risk reduction practices and was educated on things to look for to return to therapy due to possible increase in fluid. Pt was provided with hand out for risk reduction practices. Pt will just have this one visit but will return with any loss of ROM or increased edema in  the LUE.    Personal Factors and Comorbidities  Comorbidity 1    Comorbidities  Radiation therapy    Stability/Clinical Decision Making  Stable/Uncomplicated    Clinical Decision Making  Low    Rehab Potential  Excellent    PT Frequency  One time visit    PT Treatment/Interventions  Therapeutic activities;Patient/family education    PT Next Visit Plan  Pt will only come for this one visit. She will return if needed.    Consulted and Agree with Plan of Care  Patient       Patient will benefit from skilled therapeutic intervention in order to improve the following deficits and impairments:  Decreased knowledge of precautions  Visit Diagnosis: Malignant neoplasm of lower-outer quadrant of left breast of female, estrogen receptor positive ([place])  Lymphedema, not elsewhere classified     Problem List Patient Active Problem List   Diagnosis Date Noted  . Family history of breast cancer   . Family history of colon cancer   . Family history of ovarian cancer   . Melanoma in situ of cheek ([place])   . Malignant neoplasm of lower-outer quadrant of left female breast ([place]) 01/11/2020  . Polycythemia, secondary 12/04/2014  . Breast cancer of lower-inner quadrant of right female breast ([place]) 10/10/2011    Ander Purpura, PT 03/01/2020, 11:00 AM  [place], [place], [address] Phone: [phone]   Fax:   [phone]  Name: Breanna Neal MRN: [id number] Date of Birth: 01/31/1946

## 2020-03-02 ENCOUNTER — Other Ambulatory Visit: Payer: Self-pay

## 2020-03-02 ENCOUNTER — Encounter: Payer: Self-pay | Admitting: Genetic Counselor

## 2020-03-02 ENCOUNTER — Telehealth: Payer: Self-pay | Admitting: Genetic Counselor

## 2020-03-02 ENCOUNTER — Ambulatory Visit
Admission: RE | Admit: 2020-03-02 | Discharge: 2020-03-02 | Disposition: A | Discharge status: Home / Self Care | Payer: Medicare Other | Source: Ambulatory Visit | Attending: Radiation Oncology | Admitting: Radiation Oncology

## 2020-03-02 DIAGNOSIS — Z1379 Encounter for other screening for genetic and chromosomal anomalies: Secondary | ICD-10-CM | POA: Insufficient documentation

## 2020-03-02 DIAGNOSIS — Z510 Encounter for antineoplastic radiation therapy: Secondary | ICD-10-CM | POA: Diagnosis not present

## 2020-03-02 DIAGNOSIS — C50512 Malignant neoplasm of lower-outer quadrant of left female breast: Secondary | ICD-10-CM | POA: Diagnosis not present

## 2020-03-02 NOTE — Telephone Encounter (Signed)
LM on VM that results are back and to please call.  Left CB instructions. 

## 2020-03-05 ENCOUNTER — Other Ambulatory Visit: Payer: Self-pay

## 2020-03-05 ENCOUNTER — Ambulatory Visit
Admission: RE | Admit: 2020-03-05 | Discharge: 2020-03-05 | Disposition: A | Discharge status: Home / Self Care | Payer: Medicare Other | Source: Ambulatory Visit | Attending: Radiation Oncology | Admitting: Radiation Oncology

## 2020-03-05 DIAGNOSIS — C50512 Malignant neoplasm of lower-outer quadrant of left female breast: Secondary | ICD-10-CM | POA: Diagnosis not present

## 2020-03-05 DIAGNOSIS — Z510 Encounter for antineoplastic radiation therapy: Secondary | ICD-10-CM | POA: Diagnosis not present

## 2020-03-05 NOTE — Telephone Encounter (Signed)
LM on VM that results are back and to please call for them.  Left CB instructions.

## 2020-03-06 ENCOUNTER — Ambulatory Visit: Payer: Self-pay | Admitting: Genetic Counselor

## 2020-03-06 ENCOUNTER — Ambulatory Visit
Admission: RE | Admit: 2020-03-06 | Discharge: 2020-03-06 | Disposition: A | Discharge status: Home / Self Care | Payer: Medicare Other | Source: Ambulatory Visit | Attending: Radiation Oncology | Admitting: Radiation Oncology

## 2020-03-06 ENCOUNTER — Other Ambulatory Visit: Payer: Self-pay

## 2020-03-06 DIAGNOSIS — C50512 Malignant neoplasm of lower-outer quadrant of left female breast: Secondary | ICD-10-CM

## 2020-03-06 DIAGNOSIS — Z510 Encounter for antineoplastic radiation therapy: Secondary | ICD-10-CM | POA: Diagnosis not present

## 2020-03-06 DIAGNOSIS — Z1379 Encounter for other screening for genetic and chromosomal anomalies: Secondary | ICD-10-CM

## 2020-03-06 NOTE — Telephone Encounter (Signed)
Revealed negative genetic testing.  Discussed that we do not know why she has breast cancer and melanoma or why there is cancer in the family. It could be due to a different gene that we are not testing, or maybe our current technology may not be able to pick something up.  It will be important for her to keep in contact with genetics to keep up with whether additional testing may be needed.

## 2020-03-06 NOTE — Progress Notes (Signed)
HPI:  Ms. Castrellon was previously seen in the [facility] clinic due to a personal and family history of cancer and concerns regarding a hereditary predisposition to cancer. Please refer to our prior cancer genetics clinic note for more information regarding our discussion, assessment and recommendations, at the time. Ms. Hinchman recent genetic test results were disclosed to her, as were recommendations warranted by these results. These results and recommendations are discussed in more detail below.  CANCER HISTORY:  Oncology History  Breast cancer of lower-inner quadrant of right female breast ([place])  12/31/2010 Initial Diagnosis   Invasive ductal carcinoma grade 1 ER 90% PR 96% Ki-67 11% HER-2 negative ratio 1.08   01/30/2011 Surgery   Right breast mastectomy: No invasive cancer, high-grade DCIS with comedonecrosis 3 lymph nodes negative: Followed by immediate reconstruction with implant   05/29/2011 - 06/25/2018 Anti-estrogen oral therapy   Aromasin 25 mg daily, due to cost, changed to Arimidex 1 mg daily 12/04/2014   Malignant neoplasm of lower-outer quadrant of left female breast ([place])  01/02/2020 Initial Diagnosis   Mammogram showed a 1.0cm indeterminate mass in the left breast. Biopsy showed IDC, grade 2, HER-2 equivocal by IHC, negative by FISH, ER+ 100%, PR+ 5%, Ki67 5%   01/02/2020 Cancer Staging   Staging form: Breast, AJCC 8th Edition - Clinical stage from 01/02/2020: Stage IA (cT1b, cN0, cM0, G2, ER+, PR+, HER2-) - Signed by Eppie Gibson, MD on 01/18/2020   01/30/2020 Surgery   Left lumpectomy Donne [facility]el): IDC, grade 2, 1.3cm, with intermediate grade DCIS, clear margins, 3 left axillary lymph nodes negative   02/21/2020 Cancer Staging   Staging form: Breast, AJCC 8th Edition - Pathologic stage from 02/21/2020: Stage IA (pT1c, pN0, cM0, G2, ER+, PR+, HER2-) - Signed by Eppie Gibson, MD on 02/21/2020     FAMILY HISTORY:  We obtained a detailed, 4-generation family history.   Significant diagnoses are listed below: Family History  Problem Relation Age of Onset  . Colon cancer Mother 42  . Heart disease Father   . Dementia Father   . Ovarian cancer Sister 58  . Leukemia Sister 19       Acute leukemia  . Other Maternal Grandmother        died in childbirth  . Healthy Son   . Breast cancer Paternal Aunt        >50  . Breast cancer Cousin 74       maternal cousin    The patient has one son who is cancer free.  She has two sisters, one who had ovarian cancer at 71 and the other who had acute leukemia at 32 and died from this.  Both parents are deceased.  The patient's father died of old age and dementia.  He had two brothers and two sisters, one sister had breast cancer.  His parents are deceased.  The patient's mother had colon cancer at 53.  She had a full brother who died in childbirth with her mother, and three paternal half sisters.  One sister has a daughter who had breast cancer at 70.  There is no other reported family history of cancer.  Ms. Heidler is unaware of previous family history of genetic testing for hereditary cancer risks. Patient's maternal ancestors are of [place] descent, and paternal ancestors are of [place] descent. There is no reported Ashkenazi Jewish ancestry. There is no known consanguinity.    GENETIC TEST RESULTS: Genetic testing reported out on February 29, 2020 through the common hereditary  cancer panel found no pathogenic mutations. The Common Hereditary Gene Panel offered by [facility] includes sequencing and/or deletion duplication testing of the following 48 genes: APC, ATM, AXIN2, BARD1, BMPR1A, BRCA1, BRCA2, BRIP1, CDH1, CDK4, CDKN2A (p14ARF), CDKN2A (p16INK4a), CHEK2, CTNNA1, DICER1, EPCAM (Deletion/duplication testing only), GREM1 (promoter region deletion/duplication testing only), KIT, MEN1, MLH1, MSH2, MSH3, MSH6, MUTYH, NBN, NF1, NHTL1, PALB2, PDGFRA, PMS2, POLD1, POLE, PTEN, RAD50, RAD51C, RAD51D, RNF43, SDHB,  SDHC, SDHD, SMAD4, SMARCA4. STK11, TP53, TSC1, TSC2, and VHL.  The following genes were evaluated for sequence changes only: SDHA and HOXB13 c.251G>A variant only. The test report has been scanned into EPIC and is located under the Molecular Pathology section of the Results Review tab.  A portion of the result report is included below for reference.     We discussed with Ms. Mayhall that because current genetic testing is not perfect, it is possible there may be a gene mutation in one of these genes that current testing cannot detect, but that chance is small.  We also discussed, that there could be another gene that has not yet been discovered, or that we have not yet tested, that is responsible for the cancer diagnoses in the family. It is also possible there is a hereditary cause for the cancer in the family that Ms. Kwan did not inherit and therefore was not identified in her testing.  Therefore, it is important to remain in touch with cancer genetics in the future so that we can continue to offer Ms. Evett the most up to date genetic testing.   Genetic testing did identify a variant of uncertain significance (VUS) was identified in the MUTYH gene called c.925C>T (p.Arg309Cys).  At this time, it is unknown if this variant is associated with increased cancer risk or if this is a normal finding, but most variants such as this get reclassified to being inconsequential. It should not be used to make medical management decisions. With time, we suspect the lab will determine the significance of this variant, if any. If we do learn more about it, we will try to contact Ms. Voris to discuss it further. However, it is important to stay in touch with [place] periodically and keep the address and phone number up to date.  ADDITIONAL GENETIC TESTING: We discussed with Ms. Santaella that there are other genes that are associated with increased cancer risk that can be analyzed. Should Ms. Zywicki wish to pursue additional  genetic testing, we are happy to discuss and coordinate this testing, at any time.    CANCER SCREENING RECOMMENDATIONS: Ms. Hiraldo test result is considered negative (normal).  This means that we have not identified a hereditary cause for her personal and family history of cancer at this time. Most cancers happen by chance and this negative test suggests that her cancer may fall into this category.    While reassuring, this does not definitively rule out a hereditary predisposition to cancer. It is still possible that there could be genetic mutations that are undetectable by current technology. There could be genetic mutations in genes that have not been tested or identified to increase cancer risk.  Therefore, it is recommended she continue to follow the cancer management and screening guidelines provided by her oncology and primary healthcare provider.   An individual's cancer risk and medical management are not determined by genetic test results alone. Overall cancer risk assessment incorporates additional factors, including personal medical history, family history, and any available genetic information that may result in a  personalized plan for cancer prevention and surveillance  RECOMMENDATIONS FOR FAMILY MEMBERS:  Individuals in this family might be at some increased risk of developing cancer, over the general population risk, simply due to the family history of cancer.  We recommended women in this family have a yearly mammogram beginning at age 68, or 73 years younger than the earliest onset of cancer, an annual clinical breast exam, and perform monthly breast self-exams. Women in this family should also have a gynecological exam as recommended by their primary provider. All family members should have a colonoscopy by age 43.  FOLLOW-UP: Lastly, we discussed with Ms. Mcshan that cancer genetics is a rapidly advancing field and it is possible that new genetic tests will be appropriate for her  and/or her family members in the future. We encouraged her to remain in contact with cancer genetics on an annual basis so we can update her personal and family histories and let her know of advances in cancer genetics that may benefit this family.   Our contact number was provided. Ms. Fambrough questions were answered to her satisfaction, and she knows she is welcome to call us at anytime with additional questions or concerns.   Roma Kayser, [place], [facility] Licensed, Certified Genetic Counselor [email or web address]

## 2020-03-07 ENCOUNTER — Other Ambulatory Visit: Payer: Self-pay

## 2020-03-07 ENCOUNTER — Ambulatory Visit
Admission: RE | Admit: 2020-03-07 | Discharge: 2020-03-07 | Disposition: A | Discharge status: Home / Self Care | Payer: Medicare Other | Source: Ambulatory Visit | Attending: Radiation Oncology | Admitting: Radiation Oncology

## 2020-03-07 DIAGNOSIS — Z510 Encounter for antineoplastic radiation therapy: Secondary | ICD-10-CM | POA: Diagnosis not present

## 2020-03-07 DIAGNOSIS — C50512 Malignant neoplasm of lower-outer quadrant of left female breast: Secondary | ICD-10-CM | POA: Diagnosis not present

## 2020-03-08 ENCOUNTER — Other Ambulatory Visit: Payer: Self-pay

## 2020-03-08 ENCOUNTER — Ambulatory Visit
Admission: RE | Admit: 2020-03-08 | Discharge: 2020-03-08 | Disposition: A | Discharge status: Home / Self Care | Payer: Medicare Other | Source: Ambulatory Visit | Attending: Radiation Oncology | Admitting: Radiation Oncology

## 2020-03-08 DIAGNOSIS — Z510 Encounter for antineoplastic radiation therapy: Secondary | ICD-10-CM | POA: Diagnosis not present

## 2020-03-08 DIAGNOSIS — C50512 Malignant neoplasm of lower-outer quadrant of left female breast: Secondary | ICD-10-CM | POA: Diagnosis not present

## 2020-03-09 ENCOUNTER — Ambulatory Visit
Admission: RE | Admit: 2020-03-09 | Discharge: 2020-03-09 | Disposition: A | Discharge status: Home / Self Care | Payer: Medicare Other | Source: Ambulatory Visit | Attending: Radiation Oncology | Admitting: Radiation Oncology

## 2020-03-09 ENCOUNTER — Other Ambulatory Visit: Payer: Self-pay

## 2020-03-09 DIAGNOSIS — Z510 Encounter for antineoplastic radiation therapy: Secondary | ICD-10-CM | POA: Diagnosis not present

## 2020-03-09 DIAGNOSIS — C50512 Malignant neoplasm of lower-outer quadrant of left female breast: Secondary | ICD-10-CM | POA: Diagnosis not present

## 2020-03-12 ENCOUNTER — Other Ambulatory Visit: Payer: Self-pay

## 2020-03-12 ENCOUNTER — Ambulatory Visit
Admission: RE | Admit: 2020-03-12 | Discharge: 2020-03-12 | Disposition: A | Discharge status: Home / Self Care | Payer: Medicare Other | Source: Ambulatory Visit | Attending: Radiation Oncology | Admitting: Radiation Oncology

## 2020-03-12 ENCOUNTER — Ambulatory Visit: Payer: Medicare Other | Admitting: Radiation Oncology

## 2020-03-12 DIAGNOSIS — C50512 Malignant neoplasm of lower-outer quadrant of left female breast: Secondary | ICD-10-CM | POA: Diagnosis not present

## 2020-03-12 DIAGNOSIS — Z510 Encounter for antineoplastic radiation therapy: Secondary | ICD-10-CM | POA: Diagnosis not present

## 2020-03-13 ENCOUNTER — Other Ambulatory Visit: Payer: Self-pay

## 2020-03-13 ENCOUNTER — Ambulatory Visit
Admission: RE | Admit: 2020-03-13 | Discharge: 2020-03-13 | Disposition: A | Discharge status: Home / Self Care | Payer: Medicare Other | Source: Ambulatory Visit | Attending: Radiation Oncology | Admitting: Radiation Oncology

## 2020-03-13 DIAGNOSIS — C50512 Malignant neoplasm of lower-outer quadrant of left female breast: Secondary | ICD-10-CM | POA: Diagnosis not present

## 2020-03-13 DIAGNOSIS — Z510 Encounter for antineoplastic radiation therapy: Secondary | ICD-10-CM | POA: Diagnosis not present

## 2020-03-14 ENCOUNTER — Ambulatory Visit
Admission: RE | Admit: 2020-03-14 | Discharge: 2020-03-14 | Disposition: A | Discharge status: Home / Self Care | Payer: Medicare Other | Source: Ambulatory Visit | Attending: Radiation Oncology | Admitting: Radiation Oncology

## 2020-03-14 ENCOUNTER — Other Ambulatory Visit: Payer: Self-pay

## 2020-03-14 DIAGNOSIS — Z510 Encounter for antineoplastic radiation therapy: Secondary | ICD-10-CM | POA: Diagnosis not present

## 2020-03-14 DIAGNOSIS — C50512 Malignant neoplasm of lower-outer quadrant of left female breast: Secondary | ICD-10-CM | POA: Diagnosis not present

## 2020-03-15 ENCOUNTER — Other Ambulatory Visit: Payer: Self-pay

## 2020-03-15 ENCOUNTER — Ambulatory Visit
Admission: RE | Admit: 2020-03-15 | Discharge: 2020-03-15 | Disposition: A | Discharge status: Home / Self Care | Payer: Medicare Other | Source: Ambulatory Visit | Attending: Radiation Oncology | Admitting: Radiation Oncology

## 2020-03-15 DIAGNOSIS — Z510 Encounter for antineoplastic radiation therapy: Secondary | ICD-10-CM | POA: Diagnosis not present

## 2020-03-15 DIAGNOSIS — C50512 Malignant neoplasm of lower-outer quadrant of left female breast: Secondary | ICD-10-CM | POA: Diagnosis not present

## 2020-03-16 ENCOUNTER — Other Ambulatory Visit: Payer: Self-pay

## 2020-03-16 ENCOUNTER — Ambulatory Visit
Admission: RE | Admit: 2020-03-16 | Discharge: 2020-03-16 | Disposition: A | Discharge status: Home / Self Care | Payer: Medicare Other | Source: Ambulatory Visit | Attending: Radiation Oncology | Admitting: Radiation Oncology

## 2020-03-16 DIAGNOSIS — C50512 Malignant neoplasm of lower-outer quadrant of left female breast: Secondary | ICD-10-CM | POA: Diagnosis not present

## 2020-03-16 DIAGNOSIS — Z510 Encounter for antineoplastic radiation therapy: Secondary | ICD-10-CM | POA: Diagnosis not present

## 2020-03-19 ENCOUNTER — Other Ambulatory Visit: Payer: Self-pay

## 2020-03-19 ENCOUNTER — Ambulatory Visit
Admission: RE | Admit: 2020-03-19 | Discharge: 2020-03-19 | Disposition: A | Discharge status: Home / Self Care | Payer: Medicare Other | Source: Ambulatory Visit | Attending: Radiation Oncology | Admitting: Radiation Oncology

## 2020-03-19 DIAGNOSIS — C50311 Malignant neoplasm of lower-inner quadrant of right female breast: Secondary | ICD-10-CM

## 2020-03-19 DIAGNOSIS — Z510 Encounter for antineoplastic radiation therapy: Secondary | ICD-10-CM | POA: Diagnosis not present

## 2020-03-19 DIAGNOSIS — C50512 Malignant neoplasm of lower-outer quadrant of left female breast: Secondary | ICD-10-CM | POA: Diagnosis not present

## 2020-03-19 MED ORDER — SONAFINE EX EMUL
1.0000 "application " | Freq: Two times a day (BID) | CUTANEOUS | Status: DC
  Administered 2020-03-19: 1 via TOPICAL

## 2020-03-20 ENCOUNTER — Other Ambulatory Visit: Payer: Self-pay

## 2020-03-20 ENCOUNTER — Ambulatory Visit
Admission: RE | Admit: 2020-03-20 | Discharge: 2020-03-20 | Disposition: A | Discharge status: Home / Self Care | Payer: Medicare Other | Source: Ambulatory Visit | Attending: Radiation Oncology | Admitting: Radiation Oncology

## 2020-03-20 DIAGNOSIS — C50512 Malignant neoplasm of lower-outer quadrant of left female breast: Secondary | ICD-10-CM | POA: Diagnosis not present

## 2020-03-20 DIAGNOSIS — Z510 Encounter for antineoplastic radiation therapy: Secondary | ICD-10-CM | POA: Diagnosis not present

## 2020-03-21 ENCOUNTER — Ambulatory Visit
Admission: RE | Admit: 2020-03-21 | Discharge: 2020-03-21 | Disposition: A | Discharge status: Home / Self Care | Payer: Medicare Other | Source: Ambulatory Visit | Attending: Radiation Oncology | Admitting: Radiation Oncology

## 2020-03-21 ENCOUNTER — Other Ambulatory Visit: Payer: Self-pay

## 2020-03-21 DIAGNOSIS — Z510 Encounter for antineoplastic radiation therapy: Secondary | ICD-10-CM | POA: Diagnosis not present

## 2020-03-21 DIAGNOSIS — C50512 Malignant neoplasm of lower-outer quadrant of left female breast: Secondary | ICD-10-CM | POA: Diagnosis not present

## 2020-03-21 DIAGNOSIS — C50311 Malignant neoplasm of lower-inner quadrant of right female breast: Secondary | ICD-10-CM

## 2020-03-21 MED ORDER — SONAFINE EX EMUL
1.0000 "application " | Freq: Two times a day (BID) | CUTANEOUS | Status: DC
  Administered 2020-03-21: 1 via TOPICAL

## 2020-03-22 ENCOUNTER — Other Ambulatory Visit: Payer: Self-pay

## 2020-03-22 ENCOUNTER — Ambulatory Visit
Admission: RE | Admit: 2020-03-22 | Discharge: 2020-03-22 | Disposition: A | Discharge status: Home / Self Care | Payer: Medicare Other | Source: Ambulatory Visit | Attending: Radiation Oncology | Admitting: Radiation Oncology

## 2020-03-22 DIAGNOSIS — Z510 Encounter for antineoplastic radiation therapy: Secondary | ICD-10-CM | POA: Diagnosis not present

## 2020-03-22 DIAGNOSIS — C50512 Malignant neoplasm of lower-outer quadrant of left female breast: Secondary | ICD-10-CM | POA: Diagnosis not present

## 2020-03-23 ENCOUNTER — Encounter: Payer: Self-pay | Admitting: Radiation Oncology

## 2020-03-23 ENCOUNTER — Ambulatory Visit
Admission: RE | Admit: 2020-03-23 | Discharge: 2020-03-23 | Disposition: A | Discharge status: Home / Self Care | Payer: Medicare Other | Source: Ambulatory Visit | Attending: Radiation Oncology | Admitting: Radiation Oncology

## 2020-03-23 ENCOUNTER — Other Ambulatory Visit: Payer: Self-pay

## 2020-03-23 DIAGNOSIS — C50512 Malignant neoplasm of lower-outer quadrant of left female breast: Secondary | ICD-10-CM | POA: Diagnosis not present

## 2020-03-23 DIAGNOSIS — Z510 Encounter for antineoplastic radiation therapy: Secondary | ICD-10-CM | POA: Diagnosis not present

## 2020-03-25 NOTE — Progress Notes (Signed)
Patient Care Team: Crist Infante, MD as PCP - General (Internal Medicine) Lorelle Gibbs, MD as Consulting Physician (Radiology) Romine, Lubertha South, MD as Consulting Physician (Obstetrics and Gynecology) Crissie Reese, MD as Consulting Physician (Plastic Surgery)  DIAGNOSIS:    ICD-10-CM   1. Malignant neoplasm of lower-outer quadrant of left breast of female, estrogen receptor positive ([place])  C50.512    Z17.0     SUMMARY OF ONCOLOGIC HISTORY: Oncology History  Breast cancer of lower-inner quadrant of right female breast ([place])  12/31/2010 Initial Diagnosis   Invasive ductal carcinoma grade 1 ER 90% PR 96% Ki-67 11% HER-2 negative ratio 1.08   01/30/2011 Surgery   Right breast mastectomy: No invasive cancer, high-grade DCIS with comedonecrosis 3 lymph nodes negative: Followed by immediate reconstruction with implant   05/29/2011 - 06/25/2018 Anti-estrogen oral therapy   Aromasin 25 mg daily, due to cost, changed to Arimidex 1 mg daily 12/04/2014   Malignant neoplasm of lower-outer quadrant of left female breast ([place])  01/02/2020 Initial Diagnosis   Mammogram showed a 1.0cm indeterminate mass in the left breast. Biopsy showed IDC, grade 2, HER-2 equivocal by IHC, negative by FISH, ER+ 100%, PR+ 5%, Ki67 5%   01/02/2020 Cancer Staging   Staging form: Breast, AJCC 8th Edition - Clinical stage from 01/02/2020: Stage IA (cT1b, cN0, cM0, G2, ER+, PR+, HER2-) - Signed by Eppie Gibson, MD on 01/18/2020   01/30/2020 Surgery   Left lumpectomy Donne Hazel): IDC, grade 2, 1.3cm, with intermediate grade DCIS, clear margins, 3 left axillary lymph nodes negative   02/21/2020 Cancer Staging   Staging form: Breast, AJCC 8th Edition - Pathologic stage from 02/21/2020: Stage IA (pT1c, pN0, cM0, G2, ER+, PR+, HER2-) - Signed by Eppie Gibson, MD on 02/21/2020   02/28/2020 - 03/23/2020 Radiation Therapy   Adjuvant radiation therapy     CHIEF COMPLIANT: Follow-up to discuss antiestrogen therapy  INTERVAL  HISTORY: Breanna Neal is a 74 y.o. with above-mentioned history of right breast cancer and newly diagnosed left breast cancer. She underwent a left lumpectomy and completed radiation on 03/23/20. She presents to the clinic today to discuss antiestrogen treatment.    ALLERGIES:  has No Known Allergies.  MEDICATIONS:  Current Outpatient Medications  Medication Sig Dispense Refill  . aspirin 81 MG chewable tablet Chew 81 mg by mouth 2 (two) times a week.     Marland Kitchen atorvastatin (LIPITOR) 80 MG tablet Take 40 mg by mouth daily.     . Calcium Carbonate-Vitamin D (CALCIUM 600 + D PO) Take by mouth daily.     . Cholecalciferol (VITAMIN D) 1000 UNITS capsule Take 5,000 Units by mouth. Every other week    . fexofenadine (ALLEGRA) 30 MG tablet Take 30 mg by mouth as needed.     . Multiple Vitamin (MULTIVITAMIN) capsule Take 1 capsule by mouth daily.    . Omega-3 Fatty Acids (OMEGA-3 FISH OIL) 1200 MG CAPS Take 2 capsules by mouth daily.     . ondansetron (ZOFRAN) 4 MG tablet Take 1 tablet (4 mg total) by mouth every 8 (eight) hours as needed for nausea or vomiting. (Patient not taking: Reported on 02/20/2020) 10 tablet 1  . oxyCODONE (OXY IR/ROXICODONE) 5 MG immediate release tablet Take 1 tablet (5 mg total) by mouth every 6 (six) hours as needed for moderate pain, severe pain or breakthrough pain. (Patient not taking: Reported on 02/20/2020) 8 tablet 0  . TRIMETHOPRIM PO Take 100 mg by mouth as needed.     Marland Kitchen  valsartan (DIOVAN) 160 MG tablet Take 1 tablet (160 mg total) by mouth daily.     No current facility-administered medications for this visit.    PHYSICAL EXAMINATION: ECOG PERFORMANCE STATUS: 1 - Symptomatic but completely ambulatory  Vitals:   03/26/20 1459  BP: (!) 157/67  Pulse: 74  Resp: 16  Temp: 98.9 F (37.2 C)  SpO2: 98%   Filed Weights   03/26/20 1459  Weight: 159 lb 9.6 oz (72.4 kg)    LABORATORY DATA:  I have reviewed the data as listed CMP Latest Ref Rng & Units 09/04/2014  12/01/2013 06/09/2013  Glucose 70 - 140 mg/dl 125 114 107(H)  BUN 7.0 - 26.0 mg/dL 15.5 11.7 11.4  Creatinine 0.6 - 1.1 mg/dL 0.8 0.8 0.7  Sodium 136 - 145 mEq/L 141 141 140  Potassium 3.5 - 5.1 mEq/L 4.6 4.0 4.3  Chloride 98 - 107 mEq/L - - 105  CO2 22 - 29 mEq/L _0 Calcium 8.4 - 10.4 mg/dL 10.0 10.2 9.9  Total Protein 6.4 - 8.3 g/dL 7.6 7.4 7.5  Total Bilirubin 0.20 - 1.20 mg/dL 1.66(H) 1.59(H) 1.93(H)  Alkaline Phos 40 - 150 U/L 80 79 79  AST 5 - 34 U/L _1 ALT 0 - 55 U/L 26 43 29    Lab Results  Component Value Date   WBC 8.9 09/04/2014   HGB 16.4 (H) 09/04/2014   HCT 49.3 (H) 09/04/2014   MCV 89.7 09/04/2014   PLT 224 09/04/2014   NEUTROABS 5.6 09/04/2014    ASSESSMENT & PLAN:  Malignant neoplasm of lower-outer quadrant of left female breast ([place]) Stage I invasive ductal carcinoma with DCIS right breast: Initial biopsy showed invasive ductal carcinoma of the final mastectomy only showed DCIS ER/PR positive HER-2 negative: Switched from Aromasin to anastrozole December 2015.Completed 06/25/2018  Malignant neoplasm of lower-outer quadrant of left female breast ([place]) Mammogram done at [facility]: 12/27/2019: 1 cm lobulated mass with indistinct margin in the leftbreast lower outer quadrant Ultrasound-guided biopsy: Grade 2 IDC ER 100%, PR 5%, Ki-67 5%, HER-2 negative ratio 1.19  T1BN0 stage Ia 01/30/20: Left lumpectomy Donne Hazel): IDC, grade 2, 1.3cm, with intermediate grade DCIS, clear margins, 3 left axillary lymph nodes negative Adjuvant radiation 02/28/2020-03/23/2020   Treatment plan: Adjuvant antiestrogen therapy with anastrozole 1 mg daily Anastrozole counseling:We discussed the risks and benefits of anti-estrogen therapy with aromatase inhibitors. These include but not limited to insomnia, hot flashes, mood changes, vaginal dryness, bone density loss, and weight gain. We strongly believe that the benefits far outweigh the risks. Patient understands these risks and  consented to starting treatment. Planned treatment duration is 5 years.  Patient has previously taken anastrozole and notes that she can tolerate it very well.  She had hot flashes but otherwise no any other issues.  Return to clinic in 3 months for survivorship care plan visit.      No orders of the defined types were placed in this encounter.  The patient has a good understanding of the overall plan. she agrees with it. she will call with any problems that may develop before the next visit here.  Total time spent: 20 mins including face to face time and time spent for planning, charting and coordination of care  Nicholas Lose, MD 03/26/2020  I, Cloyde Reams Dorshimer, am acting as scribe for Dr. Nicholas Lose.  I have reviewed the above documentation for accuracy and completeness, and I agree with the above.

## 2020-03-26 ENCOUNTER — Inpatient Hospital Stay: Payer: Medicare Other | Attending: Hematology and Oncology | Admitting: Hematology and Oncology

## 2020-03-26 ENCOUNTER — Other Ambulatory Visit: Payer: Self-pay

## 2020-03-26 DIAGNOSIS — Z170 Estrogen receptor positive status [ER+]: Secondary | ICD-10-CM | POA: Insufficient documentation

## 2020-03-26 DIAGNOSIS — Z853 Personal history of malignant neoplasm of breast: Secondary | ICD-10-CM | POA: Diagnosis not present

## 2020-03-26 DIAGNOSIS — C50512 Malignant neoplasm of lower-outer quadrant of left female breast: Secondary | ICD-10-CM | POA: Diagnosis not present

## 2020-03-26 DIAGNOSIS — Z923 Personal history of irradiation: Secondary | ICD-10-CM | POA: Diagnosis not present

## 2020-03-26 MED ORDER — ANASTROZOLE 1 MG PO TABS: 1.0000 mg | ORAL_TABLET | Freq: Every day | ORAL | 3 refills | Status: DC

## 2020-03-26 NOTE — Assessment & Plan Note (Signed)
Stage I invasive ductal carcinoma with DCIS right breast: Initial biopsy showed invasive ductal carcinoma of the final mastectomy only showed DCIS ER/PR positive HER-2 negative: Switched from Aromasin to anastrozole December 2015.Completed 06/25/2018  Malignant neoplasm of lower-outer quadrant of left female breast ([place]) Mammogram done at [facility]: 12/27/2019: 1 cm lobulated mass with indistinct margin in the leftbreast lower outer quadrant Ultrasound-guided biopsy: Grade 2 IDC ER 100%, PR 5%, Ki-67 5%, HER-2 negative ratio 1.19  T1BN0 stage Ia 01/30/20: Left lumpectomy Donne Hazel): IDC, grade 2, 1.3cm, with intermediate grade DCIS, clear margins, 3 left axillary lymph nodes negative Adjuvant radiation 02/28/2020-03/23/2020   Treatment plan: Adjuvant antiestrogen therapy with anastrozole 1 mg daily Anastrozole counseling:We discussed the risks and benefits of anti-estrogen therapy with aromatase inhibitors. These include but not limited to insomnia, hot flashes, mood changes, vaginal dryness, bone density loss, and weight gain. We strongly believe that the benefits far outweigh the risks. Patient understands these risks and consented to starting treatment. Planned treatment duration is 5 years.  I recommended participation antiestrogen therapy adherence study.  Return to clinic in 3 months for survivorship care plan visit.

## 2020-04-09 ENCOUNTER — Telehealth: Payer: Self-pay | Admitting: Adult Health

## 2020-04-09 NOTE — Telephone Encounter (Signed)
Scheduled pr 04/05 los, patient has been called and voicemail was left.

## 2020-04-09 NOTE — Progress Notes (Signed)
  Patient Name: Breanna Neal MRN: [id number] DOB: May 01, 1946 Referring Physician: Lucia Gaskins DAVID (Profile Not Attached) Date of Service: 04/04/2020 [facility] [place]                                                        End Of Treatment Note  Diagnoses: C50.512-Malignant neoplasm of lower-outer quadrant of left female breast  Cancer Staging: Cancer Staging Breast cancer of lower-inner quadrant of right female breast ([place]) Staging form: Breast, AJCC 7th Edition - Clinical: No stage assigned - Unsigned - Pathologic: Stage IA (T1a, N0, cM0) - Signed by Rulon Eisenmenger, MD on 09/04/2014  Malignant neoplasm of lower-outer quadrant of left female breast [facility]) Staging form: Breast, AJCC 8th Edition - Clinical stage from 01/02/2020: Stage IA (cT1b, cN0, cM0, G2, ER+, PR+, HER2-) - Signed by Eppie Gibson, MD on 01/18/2020 - Pathologic stage from 02/21/2020: Stage IA (pT1c, pN0, cM0, G2, ER+, PR+, HER2-) - Signed by Eppie Gibson, MD on 02/21/2020   Intent: Curative  Radiation Treatment Dates: 02/27/2020 through 03/23/2020 Site Technique Total Dose (Gy) Dose per Fx (Gy) Completed Fx Beam Energies  Breast, Left: Breast_Lt 3D 40.05/40.05 2.67 15/15 10X  Breast, Left: Breast_Lt_Bst 3D 10/10 2 5/5 6X, 10X   Narrative: The patient tolerated radiation therapy relatively well.   Plan: The patient will follow-up with radiation oncology in 9mo or PRN.  -----------------------------------  SEppie Gibson MD

## 2020-04-25 ENCOUNTER — Telehealth: Payer: Self-pay

## 2020-04-25 ENCOUNTER — Ambulatory Visit: Payer: Medicare Other | Admitting: Radiation Oncology

## 2020-04-25 NOTE — Telephone Encounter (Signed)
Called patient x3 this morning to see if she still wanted to come into the clinic for 1 month follow-up appointment with Dr. Isidore Moos, or if she felt well enough that we could do her appointment over the phone. Left VM on both cell-phone and home phone listed in chart requesting call back with patient's preference.

## 2020-04-25 NOTE — Telephone Encounter (Signed)
I called the patient today about her upcoming follow-up appointment in radiation oncology.   Given the state of the COVID-19 pandemic, concerning case numbers in our community, and guidance from [facility], I offered a phone assessment with the patient to determine if coming to the clinic was necessary. She accepted.  I let the patient know that I had spoken with Dr. Isidore Moos, and she wanted them to know the importance of washing their hands for at least 20 seconds at a time, especially after going out in public, and before they eat.  Limit going out in public whenever possible. Do not touch your face, unless your hands are clean, such as when bathing. Get plenty of rest, eat well, and stay hydrated. Patient verbalized understanding and agreement.   The patient denies any symptomatic concerns. She has an occasional sharp pain to the left breast, but states it is not new and resolves on its own. She denise any chest pain, shortness of breath, or issues with range of motion to the left arm. Her energy is returning and she reports a healthy appetite. Specifically, they report good healing of their skin in the radiation fields. I recommended that she continue skin care by applying oil or lotion with vitamin E to the skin in the radiation fields, BID, for 2 more months.  She stated she would buy some lotion with vitamin E next time she went to the store. Overall she reports "feeling great" and voiced much appreciation for her care from Dr. Isidore Moos and her team.  Continue follow-up with medical oncology - follow-up is scheduled on 07/03/2020 with Mendel Ryder Causey-NP and the Survivorship Program.  I explained that yearly mammograms are important for patients with intact breast tissue, and physical exams are important after mastectomy for patients that cannot undergo mammography. Patient verbalized understanding and agreement.  I encouraged her to call if she had further questions or concerns about her  healing. Otherwise, she will follow-up PRN in radiation oncology. Patient is pleased with this plan, and we will cancel her upcoming follow-up to reduce the risk of COVID-19 transmission.

## 2020-05-08 DIAGNOSIS — R030 Elevated blood-pressure reading, without diagnosis of hypertension: Secondary | ICD-10-CM | POA: Diagnosis not present

## 2020-05-08 DIAGNOSIS — E1169 Type 2 diabetes mellitus with other specified complication: Secondary | ICD-10-CM | POA: Diagnosis not present

## 2020-05-08 DIAGNOSIS — E785 Hyperlipidemia, unspecified: Secondary | ICD-10-CM | POA: Diagnosis not present

## 2020-05-08 DIAGNOSIS — C50512 Malignant neoplasm of lower-outer quadrant of left female breast: Secondary | ICD-10-CM | POA: Diagnosis not present

## 2020-05-08 DIAGNOSIS — R197 Diarrhea, unspecified: Secondary | ICD-10-CM | POA: Diagnosis not present

## 2020-05-08 DIAGNOSIS — I10 Essential (primary) hypertension: Secondary | ICD-10-CM | POA: Diagnosis not present

## 2020-05-08 DIAGNOSIS — M8580 Other specified disorders of bone density and structure, unspecified site: Secondary | ICD-10-CM | POA: Diagnosis not present

## 2020-07-03 ENCOUNTER — Inpatient Hospital Stay: Payer: Medicare Other | Attending: Hematology and Oncology | Admitting: Adult Health

## 2020-07-03 DIAGNOSIS — C50311 Malignant neoplasm of lower-inner quadrant of right female breast: Secondary | ICD-10-CM

## 2020-07-03 DIAGNOSIS — Z170 Estrogen receptor positive status [ER+]: Secondary | ICD-10-CM

## 2020-07-03 DIAGNOSIS — C50512 Malignant neoplasm of lower-outer quadrant of left female breast: Secondary | ICD-10-CM

## 2020-07-03 NOTE — Progress Notes (Signed)
I asked my nurse to reach out to patient and get her rescheduled.  Unable to connect to video visit today.  Wilber Bihari, NP

## 2020-07-06 ENCOUNTER — Telehealth: Payer: Self-pay | Admitting: Adult Health

## 2020-07-06 NOTE — Telephone Encounter (Signed)
Called pt per 7/13 sch msg - no answer . Left message for patient to call back to reschedule appt.

## 2020-10-01 DIAGNOSIS — Z85828 Personal history of other malignant neoplasm of skin: Secondary | ICD-10-CM | POA: Diagnosis not present

## 2020-10-01 DIAGNOSIS — D0471 Carcinoma in situ of skin of right lower limb, including hip: Secondary | ICD-10-CM | POA: Diagnosis not present

## 2020-10-01 DIAGNOSIS — L821 Other seborrheic keratosis: Secondary | ICD-10-CM | POA: Diagnosis not present

## 2020-10-06 DIAGNOSIS — Z23 Encounter for immunization: Secondary | ICD-10-CM | POA: Diagnosis not present

## 2020-11-05 DIAGNOSIS — R300 Dysuria: Secondary | ICD-10-CM | POA: Diagnosis not present

## 2020-11-22 DIAGNOSIS — E1169 Type 2 diabetes mellitus with other specified complication: Secondary | ICD-10-CM | POA: Diagnosis not present

## 2020-11-22 DIAGNOSIS — E785 Hyperlipidemia, unspecified: Secondary | ICD-10-CM | POA: Diagnosis not present

## 2020-11-29 ENCOUNTER — Other Ambulatory Visit: Payer: Self-pay | Admitting: Internal Medicine

## 2020-11-29 DIAGNOSIS — R809 Proteinuria, unspecified: Secondary | ICD-10-CM | POA: Diagnosis not present

## 2020-11-29 DIAGNOSIS — Z1212 Encounter for screening for malignant neoplasm of rectum: Secondary | ICD-10-CM | POA: Diagnosis not present

## 2020-11-29 DIAGNOSIS — M8580 Other specified disorders of bone density and structure, unspecified site: Secondary | ICD-10-CM | POA: Diagnosis not present

## 2020-11-29 DIAGNOSIS — Z0000 Encounter for general adult medical examination without abnormal findings: Secondary | ICD-10-CM | POA: Diagnosis not present

## 2020-11-29 DIAGNOSIS — R82998 Other abnormal findings in urine: Secondary | ICD-10-CM | POA: Diagnosis not present

## 2020-11-29 DIAGNOSIS — J309 Allergic rhinitis, unspecified: Secondary | ICD-10-CM | POA: Diagnosis not present

## 2020-11-29 DIAGNOSIS — E785 Hyperlipidemia, unspecified: Secondary | ICD-10-CM | POA: Diagnosis not present

## 2020-11-29 DIAGNOSIS — E1169 Type 2 diabetes mellitus with other specified complication: Secondary | ICD-10-CM | POA: Diagnosis not present

## 2020-11-29 DIAGNOSIS — N318 Other neuromuscular dysfunction of bladder: Secondary | ICD-10-CM | POA: Diagnosis not present

## 2020-11-29 DIAGNOSIS — I10 Essential (primary) hypertension: Secondary | ICD-10-CM | POA: Diagnosis not present

## 2020-11-29 DIAGNOSIS — I517 Cardiomegaly: Secondary | ICD-10-CM | POA: Diagnosis not present

## 2020-12-04 ENCOUNTER — Ambulatory Visit: Payer: Medicare Other

## 2020-12-04 ENCOUNTER — Ambulatory Visit: Payer: Medicare Other | Attending: Internal Medicine

## 2020-12-04 DIAGNOSIS — Z23 Encounter for immunization: Secondary | ICD-10-CM

## 2020-12-04 NOTE — Progress Notes (Signed)
   Covid-19 Vaccination Clinic  Name:  Breanna Neal    MRN: [id number] DOB: 1946/10/17  12/04/2020  Breanna Neal was observed post Covid-19 immunization for 15 minutes without incident. She was provided with Vaccine Information Sheet and instruction to access the V-Safe system.   Breanna Neal was instructed to call 911 with any severe reactions post vaccine: Marland Kitchen Difficulty breathing  . Swelling of face and throat  . A fast heartbeat  . A bad rash all over body  . Dizziness and weakness   Immunizations Administered    Name Date Dose VIS Date Route   Pfizer COVID-19 Vaccine 12/04/2020  3:48 PM 0.3 mL 10/10/2020 Intramuscular   Manufacturer: [place]   Lot: [id number]   [place]: [id number]

## 2020-12-19 ENCOUNTER — Ambulatory Visit
Admission: RE | Admit: 2020-12-19 | Discharge: 2020-12-19 | Disposition: A | Discharge status: Home / Self Care | Payer: No Typology Code available for payment source | Source: Ambulatory Visit | Attending: Internal Medicine | Admitting: Internal Medicine

## 2020-12-19 DIAGNOSIS — E785 Hyperlipidemia, unspecified: Secondary | ICD-10-CM | POA: Diagnosis not present

## 2020-12-28 ENCOUNTER — Telehealth: Payer: Self-pay | Admitting: Hematology and Oncology

## 2020-12-28 NOTE — Telephone Encounter (Signed)
Called pt per 1/5 sch msg - no answer. Left message for patient to call back to schedule appt.

## 2021-01-21 DIAGNOSIS — E1169 Type 2 diabetes mellitus with other specified complication: Secondary | ICD-10-CM | POA: Diagnosis not present

## 2021-01-21 DIAGNOSIS — R0981 Nasal congestion: Secondary | ICD-10-CM | POA: Diagnosis not present

## 2021-01-21 DIAGNOSIS — C50512 Malignant neoplasm of lower-outer quadrant of left female breast: Secondary | ICD-10-CM | POA: Diagnosis not present

## 2021-01-21 DIAGNOSIS — M8589 Other specified disorders of bone density and structure, multiple sites: Secondary | ICD-10-CM | POA: Diagnosis not present

## 2021-01-21 DIAGNOSIS — J0111 Acute recurrent frontal sinusitis: Secondary | ICD-10-CM | POA: Diagnosis not present

## 2021-01-21 DIAGNOSIS — Z1152 Encounter for screening for COVID-19: Secondary | ICD-10-CM | POA: Diagnosis not present

## 2021-01-23 ENCOUNTER — Telehealth: Payer: Self-pay | Admitting: Hematology and Oncology

## 2021-01-23 ENCOUNTER — Other Ambulatory Visit: Payer: Self-pay | Admitting: Hematology and Oncology

## 2021-01-23 NOTE — Telephone Encounter (Signed)
Left message with follow-up appointment per 2/2 schedule message. Gave option to call back to reschedule if needed.

## 2021-01-30 DIAGNOSIS — R921 Mammographic calcification found on diagnostic imaging of breast: Secondary | ICD-10-CM | POA: Diagnosis not present

## 2021-02-27 DIAGNOSIS — Z170 Estrogen receptor positive status [ER+]: Secondary | ICD-10-CM | POA: Diagnosis not present

## 2021-02-27 DIAGNOSIS — Z853 Personal history of malignant neoplasm of breast: Secondary | ICD-10-CM | POA: Diagnosis not present

## 2021-02-27 DIAGNOSIS — C50912 Malignant neoplasm of unspecified site of left female breast: Secondary | ICD-10-CM | POA: Diagnosis not present

## 2021-03-24 NOTE — Progress Notes (Incomplete)
Patient Care Team: Crist Infante, MD as PCP - General (Internal Medicine) Lorelle Gibbs, MD as Consulting Physician (Radiology) Romine, Lubertha South, MD as Consulting Physician (Obstetrics and Gynecology) Crissie Reese, MD as Consulting Physician (Plastic Surgery) Rolm Bookbinder, MD as Consulting Physician (General Surgery) Eppie Gibson, MD as Attending Physician (Radiation Oncology) Nicholas Lose, MD as Consulting Physician (Hematology and Oncology)  DIAGNOSIS: No diagnosis found.  SUMMARY OF ONCOLOGIC HISTORY: Oncology History  Breast cancer of lower-inner quadrant of right female breast ([place])  12/31/2010 Initial Diagnosis   Invasive ductal carcinoma grade 1 ER 90% PR 96% Ki-67 11% HER-2 negative ratio 1.08   01/30/2011 Surgery   Right breast mastectomy: No invasive cancer, high-grade DCIS with comedonecrosis 3 lymph nodes negative: Followed by immediate reconstruction with implant   05/29/2011 - 06/25/2018 Anti-estrogen oral therapy   Aromasin 25 mg daily, due to cost, changed to Arimidex 1 mg daily 12/04/2014   Malignant neoplasm of lower-outer quadrant of left female breast ([place])  01/02/2020 Initial Diagnosis   Mammogram showed a 1.0cm indeterminate mass in the left breast. Biopsy showed IDC, grade 2, HER-2 equivocal by IHC, negative by FISH, ER+ 100%, PR+ 5%, Ki67 5%   01/02/2020 Cancer Staging   Staging form: Breast, AJCC 8th Edition - Clinical stage from 01/02/2020: Stage IA (cT1b, cN0, cM0, G2, ER+, PR+, HER2-)    01/30/2020 Surgery   Left lumpectomy Donne Hazel) [phone]): IDC, grade 2, 1.3cm, with intermediate grade DCIS, clear margins, 3 left axillary lymph nodes negative   02/21/2020 Cancer Staging   Staging form: Breast, AJCC 8th Edition - Pathologic stage from 02/21/2020: Stage IA (pT1c, pN0, cM0, G2, ER+, PR+, HER2-)   02/27/2020 - 03/23/2020 Radiation Therapy   The patient initially received a dose of 40.05 Gy in 15 fractions to the breast using whole-breast  tangent fields. This was delivered using a 3-D conformal technique. The pt received a boost delivering an additional 10 Gy in 5 fractions using a electron boost with 53mV electrons. The total dose was 50.05 Gy.   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  The Common Hereditary Gene Panel offered by [facility] includes sequencing and/or deletion duplication testing of the following 48 genes: APC, ATM, AXIN2, BARD1, BMPR1A, BRCA1, BRCA2, BRIP1, CDH1, CDK4, CDKN2A (p14ARF), CDKN2A (p16INK4a), CHEK2, CTNNA1, DICER1, EPCAM (Deletion/duplication testing only), GREM1 (promoter region deletion/duplication testing only), KIT, MEN1, MLH1, MSH2, MSH3, MSH6, MUTYH, NBN, NF1, NHTL1, PALB2, PDGFRA, PMS2, POLD1, POLE, PTEN, RAD50, RAD51C, RAD51D, RNF43, SDHB, SDHC, SDHD, SMAD4, SMARCA4. STK11, TP53, TSC1, TSC2, and VHL.  The following genes were evaluated for sequence changes only: SDHA and HOXB13 c.251G>A variant only. The report date is February 29, 2020.   03/2020 - 03/2025 Anti-estrogen oral therapy   Anastrozole. Pt took Anastrozole from 11/2014-06/25/2018 and tolerated it well.     CHIEF COMPLIANT: Follow-up of bilateral breast cancers on anastrozole   INTERVAL HISTORY: Breanna Neal a 75y.o. with above-mentioned history of right breast cancer and left breast cancer and is currently on antiestrogen therapy with anastrozole.Mammogram on 01/30/21 showed no evidence of malignancy bilaterally. She presents to the clinic todayfor follow-up.    ALLERGIES:  has No Known Allergies.  MEDICATIONS:  Current Outpatient Medications  Medication Sig Dispense Refill  . anastrozole (ARIMIDEX) 1 MG tablet TAKE 1 TABLET EVERY DAY 90 tablet 0  . aspirin 81 MG chewable tablet Chew 81 mg by mouth 2 (two) times a week.     .Marland Kitchen  atorvastatin (LIPITOR) 80 MG tablet Take 40 mg by mouth daily.     . Calcium Carbonate-Vitamin D (CALCIUM 600 + D PO) Take by mouth daily.     .  Cholecalciferol (VITAMIN D) 1000 UNITS capsule Take 5,000 Units by mouth. Every other week    . fexofenadine (ALLEGRA) 30 MG tablet Take 30 mg by mouth as needed.     . Multiple Vitamin (MULTIVITAMIN) capsule Take 1 capsule by mouth daily.    . Omega-3 Fatty Acids (OMEGA-3 FISH OIL) 1200 MG CAPS Take 2 capsules by mouth daily.     Marland Kitchen TRIMETHOPRIM PO Take 100 mg by mouth as needed.     . valsartan (DIOVAN) 160 MG tablet Take 1 tablet (160 mg total) by mouth daily.     No current facility-administered medications for this visit.    PHYSICAL EXAMINATION: ECOG PERFORMANCE STATUS: {CHL ONC ECOG [phone]}  There were no vitals filed for this visit. There were no vitals filed for this visit.  BREAST:*** No palpable masses or nodules in either right or left breasts. No palpable axillary supraclavicular or infraclavicular adenopathy no breast tenderness or nipple discharge. (exam performed in the presence of a chaperone)  LABORATORY DATA:  I have reviewed the data as listed CMP Latest Ref Rng & Units 09/04/2014 12/01/2013 06/09/2013  Glucose 70 - 140 mg/dl 125 114 107(H)  BUN 7.0 - 26.0 mg/dL 15.5 11.7 11.4  Creatinine 0.6 - 1.1 mg/dL 0.8 0.8 0.7  Sodium 136 - 145 mEq/L 141 141 140  Potassium 3.5 - 5.1 mEq/L 4.6 4.0 4.3  Chloride 98 - 107 mEq/L - - 105  CO2 22 - 29 mEq/L _0 Calcium 8.4 - 10.4 mg/dL 10.0 10.2 9.9  Total Protein 6.4 - 8.3 g/dL 7.6 7.4 7.5  Total Bilirubin 0.20 - 1.20 mg/dL 1.66(H) 1.59(H) 1.93(H)  Alkaline Phos 40 - 150 U/L 80 79 79  AST 5 - 34 U/L _1 ALT 0 - 55 U/L 26 43 29    Lab Results  Component Value Date   WBC 8.9 09/04/2014   HGB 16.4 (H) 09/04/2014   HCT 49.3 (H) 09/04/2014   MCV 89.7 09/04/2014   PLT 224 09/04/2014   NEUTROABS 5.6 09/04/2014    ASSESSMENT & PLAN:  No problem-specific Assessment & Plan notes found for this encounter.    No orders of the defined types were placed in this encounter.  The patient has a good  understanding of the overall plan. she agrees with it. she will call with any problems that may develop before the next visit here.  Total time spent: *** mins including face to face time and time spent for planning, charting and coordination of care  Rulon Eisenmenger, MD, MPH 03/24/2021  I, Molly Dorshimer, am acting as scribe for Dr. Nicholas Lose.  {insert scribe attestation}

## 2021-03-24 NOTE — Assessment & Plan Note (Deleted)
Mammogram done at [facility]: 12/27/2019: 1 cm lobulated mass with indistinct margin in the leftbreast lower outer quadrant Ultrasound-guided biopsy: Grade 2 IDC ER 100%, PR 5%, Ki-67 5%, HER-2 negative ratio 1.19  T1BN0 stage Ia 01/30/20:Left lumpectomy Breanna Neal): IDC, grade 2, 1.3cm, with intermediate grade DCIS, clear margins, 3 left axillary lymph nodes negative Adjuvant radiation 02/28/2020-03/23/2020   Treatment plan: Adjuvant antiestrogen therapy with anastrozole 1 mg daily Anastrozole Toxicities:  Breast Cancer Surveillance: 1. Breast Exam: 03/25/21: Benign 2. Mammograms: 01/30/21: Benign  RTC in 1 year

## 2021-03-24 NOTE — Assessment & Plan Note (Deleted)
Stage I invasive ductal carcinoma with DCIS right breast: Initial biopsy showed invasive ductal carcinoma of the final mastectomy only showed DCIS ER/PR positive HER-2 negative: Switched from Aromasin to anastrozole December 2015.Completed 06/25/2018

## 2021-03-25 ENCOUNTER — Inpatient Hospital Stay: Payer: Medicare Other | Attending: Hematology and Oncology | Admitting: Hematology and Oncology

## 2021-03-25 DIAGNOSIS — C50311 Malignant neoplasm of lower-inner quadrant of right female breast: Secondary | ICD-10-CM

## 2021-03-25 DIAGNOSIS — C50512 Malignant neoplasm of lower-outer quadrant of left female breast: Secondary | ICD-10-CM

## 2021-04-07 ENCOUNTER — Other Ambulatory Visit: Payer: Self-pay | Admitting: Hematology and Oncology

## 2021-04-08 ENCOUNTER — Telehealth: Payer: Self-pay | Admitting: Hematology and Oncology

## 2021-04-08 NOTE — Telephone Encounter (Signed)
Scheduled appt per 4/18 sch msg. Called pt, no answer. Left msg with appt date and time.  

## 2021-06-07 DIAGNOSIS — E1169 Type 2 diabetes mellitus with other specified complication: Secondary | ICD-10-CM | POA: Diagnosis not present

## 2021-06-07 DIAGNOSIS — L821 Other seborrheic keratosis: Secondary | ICD-10-CM | POA: Diagnosis not present

## 2021-06-07 DIAGNOSIS — D1801 Hemangioma of skin and subcutaneous tissue: Secondary | ICD-10-CM | POA: Diagnosis not present

## 2021-06-07 DIAGNOSIS — Z85828 Personal history of other malignant neoplasm of skin: Secondary | ICD-10-CM | POA: Diagnosis not present

## 2021-06-07 DIAGNOSIS — L814 Other melanin hyperpigmentation: Secondary | ICD-10-CM | POA: Diagnosis not present

## 2021-06-07 DIAGNOSIS — D2272 Melanocytic nevi of left lower limb, including hip: Secondary | ICD-10-CM | POA: Diagnosis not present

## 2021-06-07 DIAGNOSIS — D225 Melanocytic nevi of trunk: Secondary | ICD-10-CM | POA: Diagnosis not present

## 2021-06-07 DIAGNOSIS — D2271 Melanocytic nevi of right lower limb, including hip: Secondary | ICD-10-CM | POA: Diagnosis not present

## 2021-06-21 ENCOUNTER — Other Ambulatory Visit: Payer: Self-pay | Admitting: Hematology and Oncology

## 2021-06-27 DIAGNOSIS — H524 Presbyopia: Secondary | ICD-10-CM | POA: Diagnosis not present

## 2021-06-27 DIAGNOSIS — H2511 Age-related nuclear cataract, right eye: Secondary | ICD-10-CM | POA: Diagnosis not present

## 2021-07-08 ENCOUNTER — Inpatient Hospital Stay: Payer: Medicare Other | Attending: Hematology and Oncology | Admitting: Hematology and Oncology

## 2021-07-08 NOTE — Assessment & Plan Note (Deleted)
Stage I invasive ductal carcinoma with DCIS right breast: Initial biopsy showed invasive ductal carcinoma of the final mastectomy only showed DCIS ER/PR positive HER-2 negative: Switched from Aromasin to anastrozole December 2015.Completed 06/25/2018 

## 2021-07-08 NOTE — Assessment & Plan Note (Deleted)
Mammogram done at [facility]: 12/27/2019: 1 cm lobulated mass with indistinct margin in the leftbreast lower outer quadrant Ultrasound-guided biopsy: Grade 2 IDC ER 100%, PR 5%, Ki-67 5%, HER-2 negative ratio 1.19  T1BN0 stage Ia 01/30/20:Left lumpectomy Breanna Neal): IDC, grade 2, 1.3cm, with intermediate grade DCIS, clear margins, 3 left axillary lymph nodes negative Adjuvant radiation 02/28/2020-03/23/2020   Treatment plan: Adjuvant antiestrogen therapy with anastrozole 1 mg daily started April 2021 Anastrozole Toxicities:  Breast Cancer Surveillance: 1. Breast Exam: 07/08/21: benign 2. Mammograms: 01/30/21: benign  RTC in 1 year

## 2021-08-04 IMAGING — CT CT CARDIAC CORONARY ARTERY CALCIUM SCORE
3 series · 14 of 20 positions shown, 16 images · non-contrast
Comparison: None.

CLINICAL DATA: Hyperlipidemia

EXAM:
CT CARDIAC CORONARY ARTERY CALCIUM SCORE
TECHNIQUE: Non-contrast imaging through the heart was performed using
prospective ECG gating. Image post processing was performed on an
independent workstation, allowing for quantitative analysis of the
heart and coronary arteries. Note that this exam targets the heart
and the chest was not imaged in its entirety.

[Series 2: calcium scoring 2.00 br64 bestdiast 69% axial · axial · 0.59mm/px · z∈[+1733,+1805]mm · 4 of 60 slices shown]
[im 12/60  vessel]
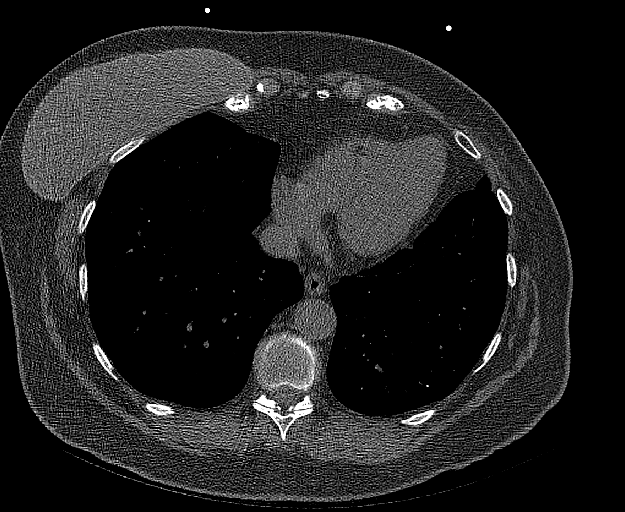
[im 24/60  vessel]
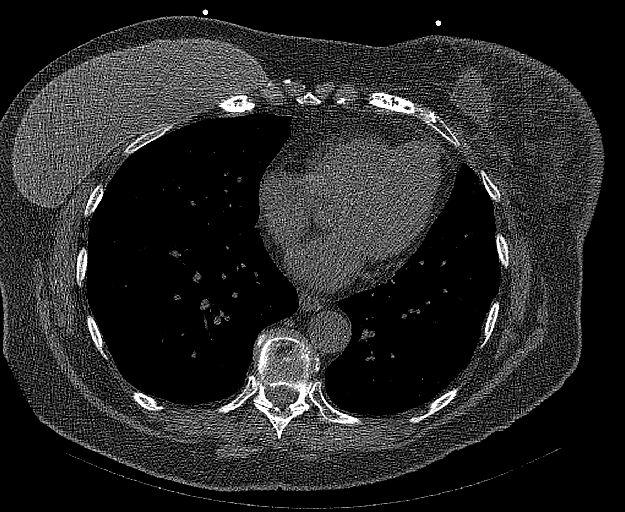
[im 36/60  vessel]
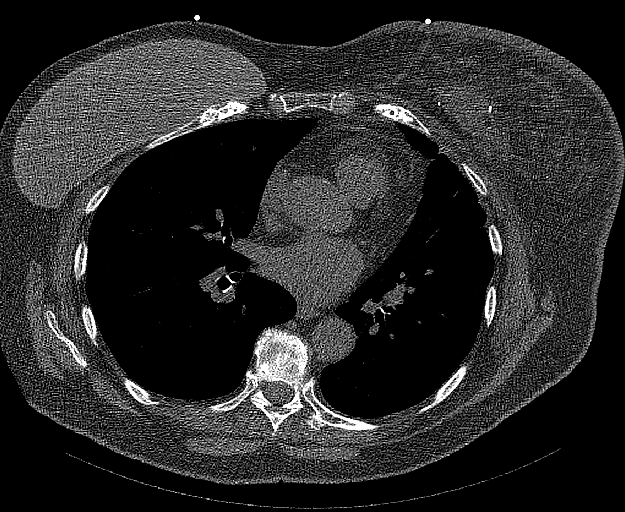
[im 48/60  vessel]
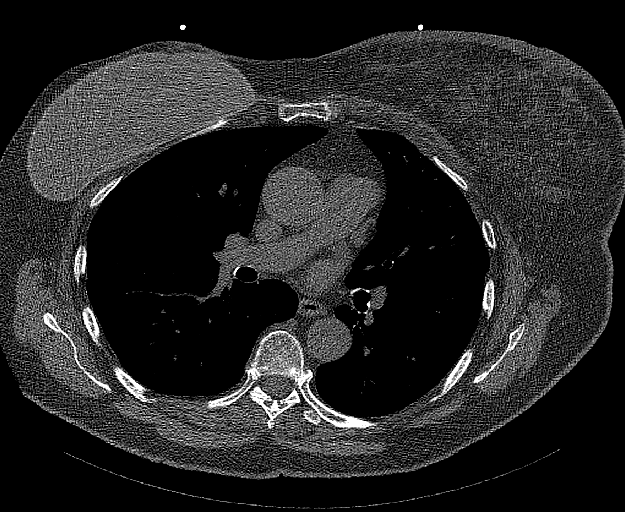

[Series 4: calcium scoring 2.00 qr36 bestdiast 69% hrt calciu · axial · 0.38mm/px · z∈[+1729,+1809]mm · 5 of 60 slices shown, 7 images]
[im 10/60  vessel]
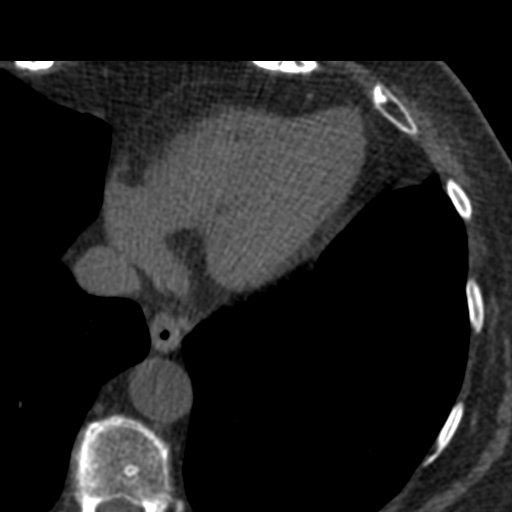
[im 10/60  lung]
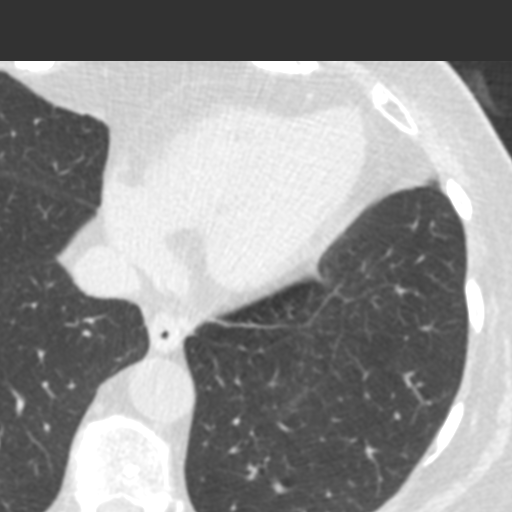
[im 20/60  vessel]
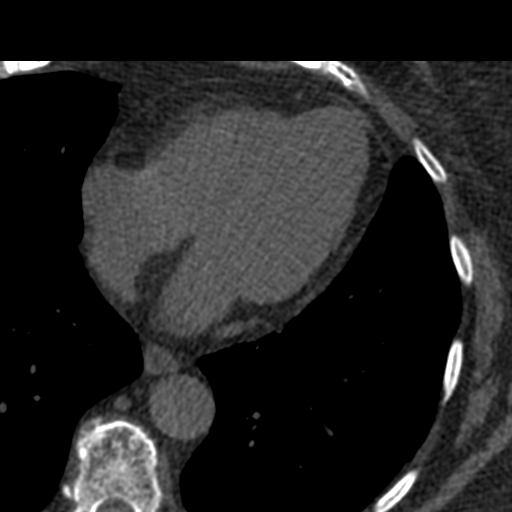
[im 30/60  vessel]
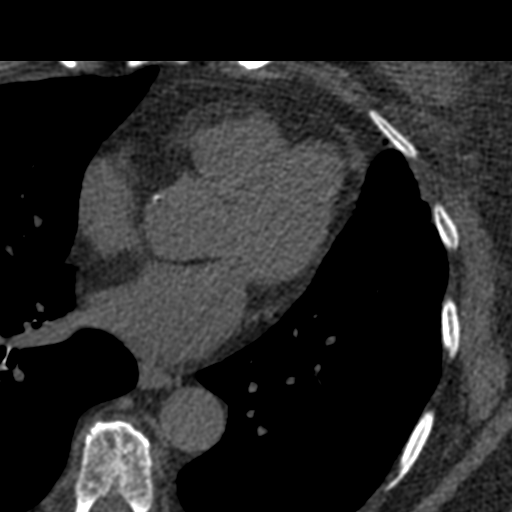
[im 40/60  vessel]
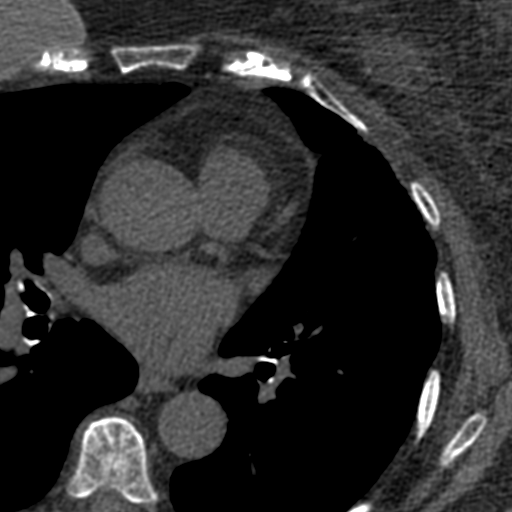
[im 50/60  vessel]
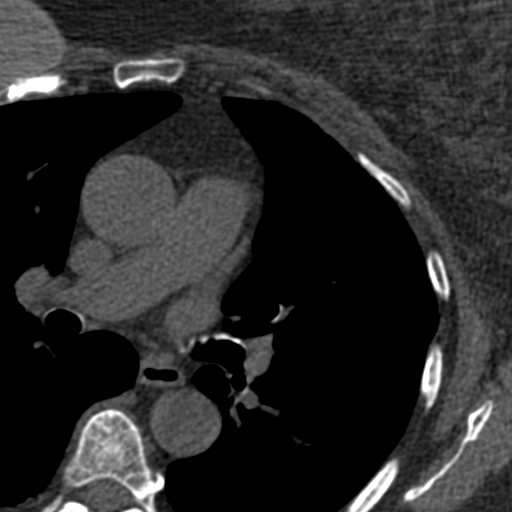
[im 50/60  lung]
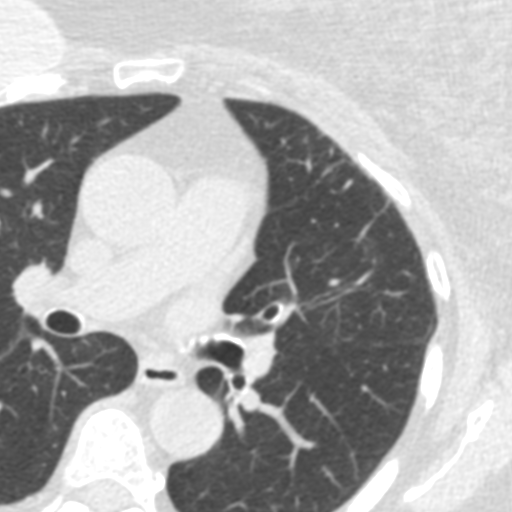

[Series 6: calcium scoring 2.00 br40 bestdiast 69% axial · axial · 0.59mm/px · z∈[+1729,+1809]mm · 5 of 60 slices shown]
[im 10/60  vessel]
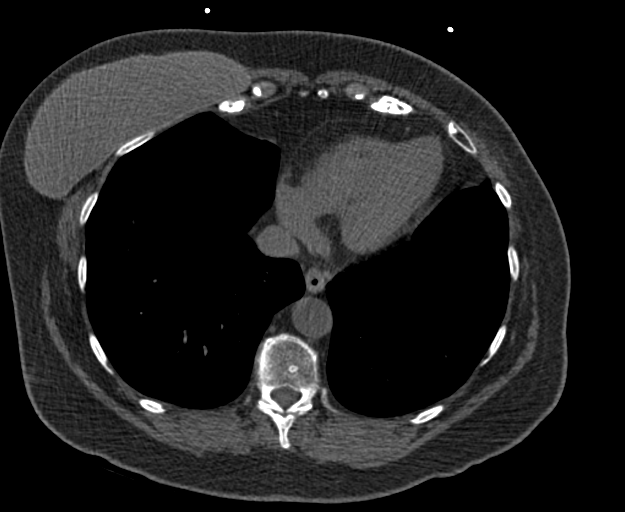
[im 20/60  vessel]
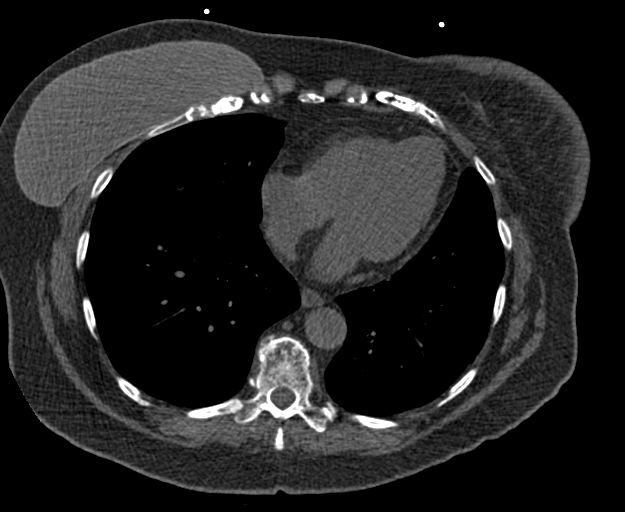
[im 30/60  vessel]
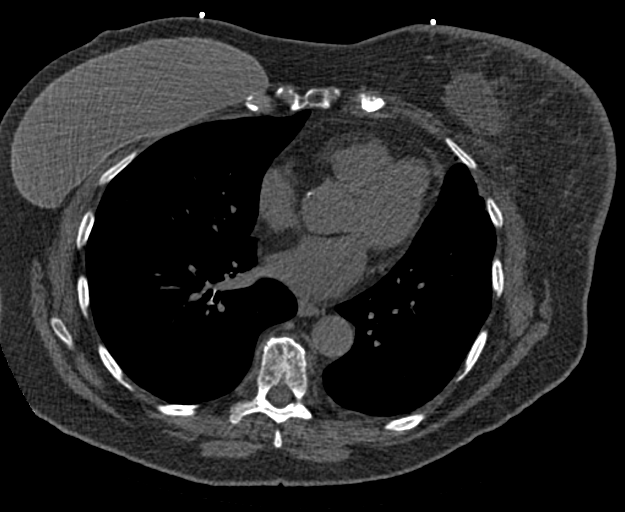
[im 40/60  vessel]
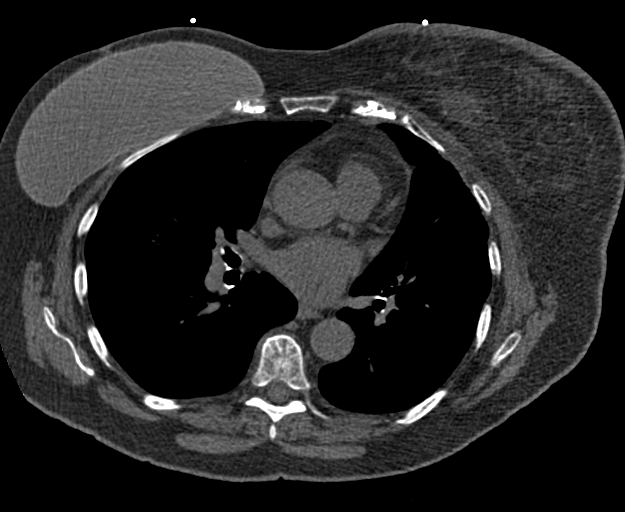
[im 50/60  vessel]
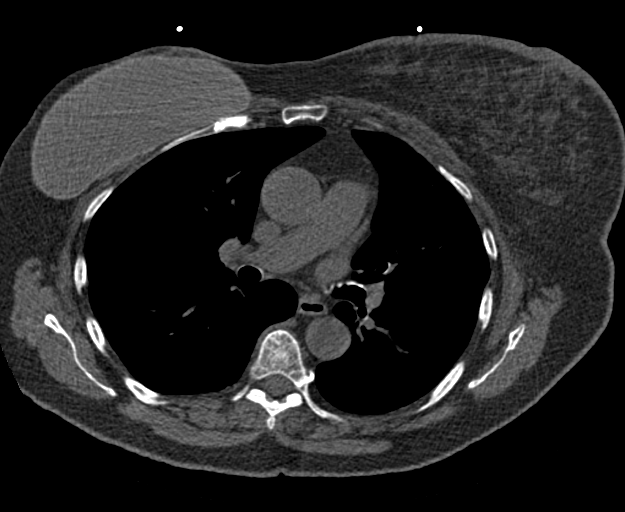

[14 of 20 positions shown; findings below may reference images not displayed]

FINDINGS: CORONARY CALCIUM SCORES:

Left Main:

LAD:

LCx: 0

RCA: 0

Total Agatston Score:

[HOSPITAL] percentile: 51

AORTA MEASUREMENTS:

Ascending Aorta: 34 mm

Descending Aorta: 24 mm

OTHER FINDINGS:

Heart is normal size. No adenopathy. Visualized lungs clear. No
effusions. Imaging into the upper abdomen demonstrates no acute
findings. Prior right breast implant. Rounded masslike density noted
in the left breast measuring 3.7 cm. Adjacent surgical clips. This
may reflect postoperative seroma/fluid collection but warrants
further evaluation. No acute bony abnormality.
IMPRESSION: The observed calcium score of 63.5 is at the percentile 51 for
subjects of the same age, gender and race/ethnicity who are free of
clinical cardiovascular disease and treated diabetes.

3.6 cm rounded possible fluid collection in the left breast adjacent
to surgical clips. Recommend correlation with breast exam and
mammography.

## 2021-10-01 DIAGNOSIS — R109 Unspecified abdominal pain: Secondary | ICD-10-CM | POA: Diagnosis not present

## 2021-10-01 DIAGNOSIS — I10 Essential (primary) hypertension: Secondary | ICD-10-CM | POA: Diagnosis not present

## 2021-10-01 DIAGNOSIS — Z1152 Encounter for screening for COVID-19: Secondary | ICD-10-CM | POA: Diagnosis not present

## 2021-10-01 DIAGNOSIS — B349 Viral infection, unspecified: Secondary | ICD-10-CM | POA: Diagnosis not present

## 2021-10-01 DIAGNOSIS — R6883 Chills (without fever): Secondary | ICD-10-CM | POA: Diagnosis not present

## 2021-10-19 DIAGNOSIS — Z23 Encounter for immunization: Secondary | ICD-10-CM | POA: Diagnosis not present

## 2021-11-04 DIAGNOSIS — N3020 Other chronic cystitis without hematuria: Secondary | ICD-10-CM | POA: Diagnosis not present

## 2022-01-07 DIAGNOSIS — H25011 Cortical age-related cataract, right eye: Secondary | ICD-10-CM | POA: Diagnosis not present

## 2022-01-13 DIAGNOSIS — I10 Essential (primary) hypertension: Secondary | ICD-10-CM | POA: Diagnosis not present

## 2022-01-13 DIAGNOSIS — E1169 Type 2 diabetes mellitus with other specified complication: Secondary | ICD-10-CM | POA: Diagnosis not present

## 2022-01-15 DIAGNOSIS — M8580 Other specified disorders of bone density and structure, unspecified site: Secondary | ICD-10-CM | POA: Diagnosis not present

## 2022-01-15 DIAGNOSIS — I10 Essential (primary) hypertension: Secondary | ICD-10-CM | POA: Diagnosis not present

## 2022-01-15 DIAGNOSIS — E785 Hyperlipidemia, unspecified: Secondary | ICD-10-CM | POA: Diagnosis not present

## 2022-01-15 DIAGNOSIS — E1169 Type 2 diabetes mellitus with other specified complication: Secondary | ICD-10-CM | POA: Diagnosis not present

## 2022-01-20 DIAGNOSIS — C50512 Malignant neoplasm of lower-outer quadrant of left female breast: Secondary | ICD-10-CM | POA: Diagnosis not present

## 2022-01-20 DIAGNOSIS — Z1339 Encounter for screening examination for other mental health and behavioral disorders: Secondary | ICD-10-CM | POA: Diagnosis not present

## 2022-01-20 DIAGNOSIS — R82998 Other abnormal findings in urine: Secondary | ICD-10-CM | POA: Diagnosis not present

## 2022-01-20 DIAGNOSIS — Z1212 Encounter for screening for malignant neoplasm of rectum: Secondary | ICD-10-CM | POA: Diagnosis not present

## 2022-01-20 DIAGNOSIS — I517 Cardiomegaly: Secondary | ICD-10-CM | POA: Diagnosis not present

## 2022-01-20 DIAGNOSIS — M8580 Other specified disorders of bone density and structure, unspecified site: Secondary | ICD-10-CM | POA: Diagnosis not present

## 2022-01-20 DIAGNOSIS — Z23 Encounter for immunization: Secondary | ICD-10-CM | POA: Diagnosis not present

## 2022-01-20 DIAGNOSIS — R809 Proteinuria, unspecified: Secondary | ICD-10-CM | POA: Diagnosis not present

## 2022-01-20 DIAGNOSIS — E1169 Type 2 diabetes mellitus with other specified complication: Secondary | ICD-10-CM | POA: Diagnosis not present

## 2022-01-20 DIAGNOSIS — I10 Essential (primary) hypertension: Secondary | ICD-10-CM | POA: Diagnosis not present

## 2022-01-20 DIAGNOSIS — Z1331 Encounter for screening for depression: Secondary | ICD-10-CM | POA: Diagnosis not present

## 2022-01-20 DIAGNOSIS — I2510 Atherosclerotic heart disease of native coronary artery without angina pectoris: Secondary | ICD-10-CM | POA: Diagnosis not present

## 2022-01-20 DIAGNOSIS — Z0000 Encounter for general adult medical examination without abnormal findings: Secondary | ICD-10-CM | POA: Diagnosis not present

## 2022-02-04 DIAGNOSIS — Z853 Personal history of malignant neoplasm of breast: Secondary | ICD-10-CM | POA: Diagnosis not present

## 2022-03-18 DIAGNOSIS — Z170 Estrogen receptor positive status [ER+]: Secondary | ICD-10-CM | POA: Diagnosis not present

## 2022-03-18 DIAGNOSIS — C50311 Malignant neoplasm of lower-inner quadrant of right female breast: Secondary | ICD-10-CM | POA: Diagnosis not present

## 2022-03-20 ENCOUNTER — Other Ambulatory Visit: Payer: Self-pay | Admitting: Hematology and Oncology

## 2022-04-08 ENCOUNTER — Telehealth: Payer: Self-pay | Admitting: Hematology and Oncology

## 2022-04-08 NOTE — Telephone Encounter (Signed)
Per 4/18 in basket called and spoke to pt.  Pt confirmed appointment  ?

## 2022-05-09 ENCOUNTER — Telehealth: Payer: Self-pay | Admitting: Hematology and Oncology

## 2022-05-09 NOTE — Telephone Encounter (Signed)
.  Called patient to schedule appointment per 53/28 inbasket, patient is aware of date and time.

## 2022-06-05 DIAGNOSIS — J0100 Acute maxillary sinusitis, unspecified: Secondary | ICD-10-CM | POA: Diagnosis not present

## 2022-06-12 DIAGNOSIS — B078 Other viral warts: Secondary | ICD-10-CM | POA: Diagnosis not present

## 2022-06-12 DIAGNOSIS — D2261 Melanocytic nevi of right upper limb, including shoulder: Secondary | ICD-10-CM | POA: Diagnosis not present

## 2022-06-12 DIAGNOSIS — D225 Melanocytic nevi of trunk: Secondary | ICD-10-CM | POA: Diagnosis not present

## 2022-06-12 DIAGNOSIS — D1801 Hemangioma of skin and subcutaneous tissue: Secondary | ICD-10-CM | POA: Diagnosis not present

## 2022-06-12 DIAGNOSIS — L814 Other melanin hyperpigmentation: Secondary | ICD-10-CM | POA: Diagnosis not present

## 2022-06-12 DIAGNOSIS — Z85828 Personal history of other malignant neoplasm of skin: Secondary | ICD-10-CM | POA: Diagnosis not present

## 2022-06-12 DIAGNOSIS — L821 Other seborrheic keratosis: Secondary | ICD-10-CM | POA: Diagnosis not present

## 2022-07-08 DIAGNOSIS — H5201 Hypermetropia, right eye: Secondary | ICD-10-CM | POA: Diagnosis not present

## 2022-07-08 DIAGNOSIS — H2511 Age-related nuclear cataract, right eye: Secondary | ICD-10-CM | POA: Diagnosis not present

## 2022-07-29 DIAGNOSIS — I517 Cardiomegaly: Secondary | ICD-10-CM | POA: Diagnosis not present

## 2022-07-29 DIAGNOSIS — E1169 Type 2 diabetes mellitus with other specified complication: Secondary | ICD-10-CM | POA: Diagnosis not present

## 2022-07-29 DIAGNOSIS — I2510 Atherosclerotic heart disease of native coronary artery without angina pectoris: Secondary | ICD-10-CM | POA: Diagnosis not present

## 2022-07-29 DIAGNOSIS — R030 Elevated blood-pressure reading, without diagnosis of hypertension: Secondary | ICD-10-CM | POA: Diagnosis not present

## 2022-07-29 DIAGNOSIS — E785 Hyperlipidemia, unspecified: Secondary | ICD-10-CM | POA: Diagnosis not present

## 2022-07-29 DIAGNOSIS — I10 Essential (primary) hypertension: Secondary | ICD-10-CM | POA: Diagnosis not present

## 2022-07-30 DIAGNOSIS — H269 Unspecified cataract: Secondary | ICD-10-CM | POA: Diagnosis not present

## 2022-07-30 DIAGNOSIS — H2511 Age-related nuclear cataract, right eye: Secondary | ICD-10-CM | POA: Diagnosis not present

## 2022-08-27 ENCOUNTER — Other Ambulatory Visit: Payer: Self-pay | Admitting: *Deleted

## 2022-08-27 MED ORDER — ANASTROZOLE 1 MG PO TABS: 1.0000 mg | ORAL_TABLET | Freq: Every day | ORAL | 0 refills | Status: DC

## 2022-09-09 NOTE — Progress Notes (Signed)
Patient Care Team: Crist Infante, MD as PCP - General (Internal Medicine) Lorelle Gibbs, MD as Consulting Physician (Radiology) Romine, Lubertha South, MD as Consulting Physician (Obstetrics and Gynecology) Crissie Reese, MD as Consulting Physician (Plastic Surgery) Rolm Bookbinder, MD as Consulting Physician (General Surgery) Eppie Gibson, MD as Attending Physician (Radiation Oncology) Nicholas Lose, MD as Consulting Physician (Hematology and Oncology)  DIAGNOSIS: No diagnosis found.  SUMMARY OF ONCOLOGIC HISTORY: Oncology History  Breast cancer of lower-inner quadrant of right female breast ([place])  12/31/2010 Initial Diagnosis   Invasive ductal carcinoma grade 1 ER 90% PR 96% Ki-67 11% HER-2 negative ratio 1.08   01/30/2011 Surgery   Right breast mastectomy: No invasive cancer, high-grade DCIS with comedonecrosis 3 lymph nodes negative: Followed by immediate reconstruction with implant   05/29/2011 - 06/25/2018 Anti-estrogen oral therapy   Aromasin 25 mg daily, due to cost, changed to Arimidex 1 mg daily 12/04/2014   Malignant neoplasm of lower-outer quadrant of left female breast ([place])  01/02/2020 Initial Diagnosis   Mammogram showed a 1.0cm indeterminate mass in the left breast. Biopsy showed IDC, grade 2, HER-2 equivocal by IHC, negative by FISH, ER+ 100%, PR+ 5%, Ki67 5%   01/02/2020 Cancer Staging   Staging form: Breast, AJCC 8th Edition - Clinical stage from 01/02/2020: Stage IA (cT1b, cN0, cM0, G2, ER+, PR+, HER2-)    01/30/2020 Surgery   Left lumpectomy Donne Hazel) [phone]): IDC, grade 2, 1.3cm, with intermediate grade DCIS, clear margins, 3 left axillary lymph nodes negative   02/21/2020 Cancer Staging   Staging form: Breast, AJCC 8th Edition - Pathologic stage from 02/21/2020: Stage IA (pT1c, pN0, cM0, G2, ER+, PR+, HER2-)   02/27/2020 - 03/23/2020 Radiation Therapy   The patient initially received a dose of 40.05 Gy in 15 fractions to the breast using whole-breast  tangent fields. This was delivered using a 3-D conformal technique. The pt received a boost delivering an additional 10 Gy in 5 fractions using a electron boost with 20mV electrons. The total dose was 50.05 Gy.   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  The Common Hereditary Gene Panel offered by [facility] includes sequencing and/or deletion duplication testing of the following 48 genes: APC, ATM, AXIN2, BARD1, BMPR1A, BRCA1, BRCA2, BRIP1, CDH1, CDK4, CDKN2A (p14ARF), CDKN2A (p16INK4a), CHEK2, CTNNA1, DICER1, EPCAM (Deletion/duplication testing only), GREM1 (promoter region deletion/duplication testing only), KIT, MEN1, MLH1, MSH2, MSH3, MSH6, MUTYH, NBN, NF1, NHTL1, PALB2, PDGFRA, PMS2, POLD1, POLE, PTEN, RAD50, RAD51C, RAD51D, RNF43, SDHB, SDHC, SDHD, SMAD4, SMARCA4. STK11, TP53, TSC1, TSC2, and VHL.  The following genes were evaluated for sequence changes only: SDHA and HOXB13 c.251G>A variant only. The report date is February 29, 2020.   03/2020 - 03/2025 Anti-estrogen oral therapy   Anastrozole. Pt took Anastrozole from 11/2014-06/25/2018 and tolerated it well.     CHIEF COMPLIANT: Follow-up on anastrazole  INTERVAL HISTORY: MSHANNA UNis a 76y.o. with above-mentioned history of right breast cancer and newly diagnosed left breast cancer. She presents to the clinic today for a follow-up.    ALLERGIES:  has No Known Allergies.  MEDICATIONS:  Current Outpatient Medications  Medication Sig Dispense Refill   anastrozole (ARIMIDEX) 1 MG tablet Take 1 tablet (1 mg total) by mouth daily. 30 tablet 0   aspirin 81 MG chewable tablet Chew 81 mg by mouth 2 (two) times a week.      atorvastatin (LIPITOR) 80 MG tablet Take 40 mg by mouth daily.  Calcium Carbonate-Vitamin D (CALCIUM 600 + D PO) Take by mouth daily.      Cholecalciferol (VITAMIN D) 1000 UNITS capsule Take 5,000 Units by mouth. Every other week     fexofenadine (ALLEGRA) 30  MG tablet Take 30 mg by mouth as needed.      Multiple Vitamin (MULTIVITAMIN) capsule Take 1 capsule by mouth daily.     Omega-3 Fatty Acids (OMEGA-3 FISH OIL) 1200 MG CAPS Take 2 capsules by mouth daily.      TRIMETHOPRIM PO Take 100 mg by mouth as needed.      valsartan (DIOVAN) 160 MG tablet Take 1 tablet (160 mg total) by mouth daily.     No current facility-administered medications for this visit.    PHYSICAL EXAMINATION: ECOG PERFORMANCE STATUS: {CHL ONC ECOG [phone]}  There were no vitals filed for this visit. There were no vitals filed for this visit.  BREAST:*** No palpable masses or nodules in either right or left breasts. No palpable axillary supraclavicular or infraclavicular adenopathy no breast tenderness or nipple discharge. (exam performed in the presence of a chaperone)  LABORATORY DATA:  I have reviewed the data as listed    Latest Ref Rng & Units 09/04/2014    1:36 PM 12/01/2013   10:39 AM 06/09/2013   10:24 AM  CMP  Glucose 70 - 140 mg/dl 125  114  107   BUN 7.0 - 26.0 mg/dL 15.5  11.7  11.4   Creatinine 0.6 - 1.1 mg/dL 0.8  0.8  0.7   Sodium 136 - 145 mEq/L 141  141  140   Potassium 3.5 - 5.1 mEq/L 4.6  4.0  4.3   Chloride 98 - 107 mEq/L   105   CO2 22 - 29 mEq/L '27  24  25   ' Calcium 8.4 - 10.4 mg/dL 10.0  10.2  9.9   Total Protein 6.4 - 8.3 g/dL 7.6  7.4  7.5   Total Bilirubin 0.20 - 1.20 mg/dL 1.66  1.59  1.93   Alkaline Phos 40 - 150 U/L 80  79  79   AST 5 - 34 U/L '21  29  20   ' ALT 0 - 55 U/L 26  43  29     Lab Results  Component Value Date   WBC 8.9 09/04/2014   HGB 16.4 (H) 09/04/2014   HCT 49.3 (H) 09/04/2014   MCV 89.7 09/04/2014   PLT 224 09/04/2014   NEUTROABS 5.6 09/04/2014    ASSESSMENT & PLAN:  No problem-specific Assessment & Plan notes found for this encounter.    No orders of the defined types were placed in this encounter.  The patient has a good understanding of the overall plan. she agrees with it. she will call with  any problems that may develop before the next visit here. Total time spent: 30 mins including face to face time and time spent for planning, charting and co-ordination of care   Suzzette Righter, [place] 09/09/22    I Gardiner Coins am scribing for Dr. Lindi Adie  ***

## 2022-09-10 ENCOUNTER — Inpatient Hospital Stay: Payer: Medicare Other | Attending: Hematology and Oncology | Admitting: Hematology and Oncology

## 2022-09-10 ENCOUNTER — Other Ambulatory Visit: Payer: Self-pay

## 2022-09-10 DIAGNOSIS — C50512 Malignant neoplasm of lower-outer quadrant of left female breast: Secondary | ICD-10-CM | POA: Insufficient documentation

## 2022-09-10 DIAGNOSIS — Z79811 Long term (current) use of aromatase inhibitors: Secondary | ICD-10-CM | POA: Insufficient documentation

## 2022-09-10 DIAGNOSIS — C50311 Malignant neoplasm of lower-inner quadrant of right female breast: Secondary | ICD-10-CM

## 2022-09-10 DIAGNOSIS — Z170 Estrogen receptor positive status [ER+]: Secondary | ICD-10-CM | POA: Insufficient documentation

## 2022-09-10 DIAGNOSIS — Z79899 Other long term (current) drug therapy: Secondary | ICD-10-CM | POA: Insufficient documentation

## 2022-09-10 DIAGNOSIS — Z853 Personal history of malignant neoplasm of breast: Secondary | ICD-10-CM | POA: Diagnosis not present

## 2022-09-10 DIAGNOSIS — Z923 Personal history of irradiation: Secondary | ICD-10-CM | POA: Diagnosis not present

## 2022-09-10 MED ORDER — ANASTROZOLE 1 MG PO TABS: 1.0000 mg | ORAL_TABLET | Freq: Every day | ORAL | 3 refills | Status: DC

## 2022-09-10 NOTE — Assessment & Plan Note (Addendum)
Mammogram done at [facility]: 12/27/2019: 1 cm lobulated mass with indistinct margin in the leftbreast lower outer quadrant Ultrasound-guided biopsy: Grade 2 IDC ER 100%, PR 5%, Ki-67 5%, HER-2 negative ratio 1.19  T1BN0 stage Ia 01/30/20:Left lumpectomy Donne Hazel): IDC, grade 2, 1.3cm, with intermediate grade DCIS, clear margins, 3 left axillary lymph nodes negative Adjuvant radiation 02/28/2020-03/23/2020   Treatment plan: Adjuvant antiestrogen therapy with anastrozole 1 mg daily Anastrozole toxicities: Denies any adverse effects of anastrozole therapy  Breast cancer surveillance: 1.  Breast exam 09/10/2022: Benign 2. mammogram 02/04/2022: [facility] benign breast density category B  Return to clinic in 1 year for follow-up

## 2022-09-10 NOTE — Assessment & Plan Note (Addendum)
Stage I invasive ductal carcinoma with DCIS right breast: Initial biopsy showed invasive ductal carcinoma of the final mastectomy only showed DCIS ER/PR positive HER-2 negative: Switched from Aromasin to anastrozole December 2015-2019 Left Breast cancer 2021

## 2022-10-01 DIAGNOSIS — J309 Allergic rhinitis, unspecified: Secondary | ICD-10-CM | POA: Diagnosis not present

## 2022-10-01 DIAGNOSIS — Z1152 Encounter for screening for COVID-19: Secondary | ICD-10-CM | POA: Diagnosis not present

## 2022-10-01 DIAGNOSIS — J0100 Acute maxillary sinusitis, unspecified: Secondary | ICD-10-CM | POA: Diagnosis not present

## 2022-10-01 DIAGNOSIS — R059 Cough, unspecified: Secondary | ICD-10-CM | POA: Diagnosis not present

## 2022-10-01 DIAGNOSIS — R5383 Other fatigue: Secondary | ICD-10-CM | POA: Diagnosis not present

## 2022-11-03 DIAGNOSIS — N3020 Other chronic cystitis without hematuria: Secondary | ICD-10-CM | POA: Diagnosis not present

## 2022-12-02 DIAGNOSIS — J029 Acute pharyngitis, unspecified: Secondary | ICD-10-CM | POA: Diagnosis not present

## 2022-12-02 DIAGNOSIS — R059 Cough, unspecified: Secondary | ICD-10-CM | POA: Diagnosis not present

## 2022-12-02 DIAGNOSIS — Z1152 Encounter for screening for COVID-19: Secondary | ICD-10-CM | POA: Diagnosis not present

## 2022-12-02 DIAGNOSIS — J0100 Acute maxillary sinusitis, unspecified: Secondary | ICD-10-CM | POA: Diagnosis not present

## 2022-12-02 DIAGNOSIS — J309 Allergic rhinitis, unspecified: Secondary | ICD-10-CM | POA: Diagnosis not present

## 2022-12-02 DIAGNOSIS — R5383 Other fatigue: Secondary | ICD-10-CM | POA: Diagnosis not present

## 2022-12-23 DIAGNOSIS — Z23 Encounter for immunization: Secondary | ICD-10-CM | POA: Diagnosis not present

## 2023-02-02 DIAGNOSIS — E785 Hyperlipidemia, unspecified: Secondary | ICD-10-CM | POA: Diagnosis not present

## 2023-02-02 DIAGNOSIS — E1169 Type 2 diabetes mellitus with other specified complication: Secondary | ICD-10-CM | POA: Diagnosis not present

## 2023-02-02 DIAGNOSIS — M8589 Other specified disorders of bone density and structure, multiple sites: Secondary | ICD-10-CM | POA: Diagnosis not present

## 2023-02-02 DIAGNOSIS — M8580 Other specified disorders of bone density and structure, unspecified site: Secondary | ICD-10-CM | POA: Diagnosis not present

## 2023-02-02 DIAGNOSIS — R7989 Other specified abnormal findings of blood chemistry: Secondary | ICD-10-CM | POA: Diagnosis not present

## 2023-02-02 DIAGNOSIS — I10 Essential (primary) hypertension: Secondary | ICD-10-CM | POA: Diagnosis not present

## 2023-02-02 DIAGNOSIS — R030 Elevated blood-pressure reading, without diagnosis of hypertension: Secondary | ICD-10-CM | POA: Diagnosis not present

## 2023-02-10 DIAGNOSIS — Z0000 Encounter for general adult medical examination without abnormal findings: Secondary | ICD-10-CM | POA: Diagnosis not present

## 2023-02-10 DIAGNOSIS — Z1339 Encounter for screening examination for other mental health and behavioral disorders: Secondary | ICD-10-CM | POA: Diagnosis not present

## 2023-02-10 DIAGNOSIS — Z1212 Encounter for screening for malignant neoplasm of rectum: Secondary | ICD-10-CM | POA: Diagnosis not present

## 2023-02-10 DIAGNOSIS — Z1331 Encounter for screening for depression: Secondary | ICD-10-CM | POA: Diagnosis not present

## 2023-02-10 DIAGNOSIS — D126 Benign neoplasm of colon, unspecified: Secondary | ICD-10-CM | POA: Diagnosis not present

## 2023-02-10 DIAGNOSIS — J309 Allergic rhinitis, unspecified: Secondary | ICD-10-CM | POA: Diagnosis not present

## 2023-02-10 DIAGNOSIS — I2510 Atherosclerotic heart disease of native coronary artery without angina pectoris: Secondary | ICD-10-CM | POA: Diagnosis not present

## 2023-02-10 DIAGNOSIS — I10 Essential (primary) hypertension: Secondary | ICD-10-CM | POA: Diagnosis not present

## 2023-02-10 DIAGNOSIS — C50512 Malignant neoplasm of lower-outer quadrant of left female breast: Secondary | ICD-10-CM | POA: Diagnosis not present

## 2023-02-10 DIAGNOSIS — R82998 Other abnormal findings in urine: Secondary | ICD-10-CM | POA: Diagnosis not present

## 2023-02-10 DIAGNOSIS — E785 Hyperlipidemia, unspecified: Secondary | ICD-10-CM | POA: Diagnosis not present

## 2023-02-10 DIAGNOSIS — R0683 Snoring: Secondary | ICD-10-CM | POA: Diagnosis not present

## 2023-02-10 DIAGNOSIS — E1169 Type 2 diabetes mellitus with other specified complication: Secondary | ICD-10-CM | POA: Diagnosis not present

## 2023-02-10 DIAGNOSIS — R413 Other amnesia: Secondary | ICD-10-CM | POA: Diagnosis not present

## 2023-02-11 DIAGNOSIS — Z1231 Encounter for screening mammogram for malignant neoplasm of breast: Secondary | ICD-10-CM | POA: Diagnosis not present

## 2023-02-13 ENCOUNTER — Encounter: Payer: Self-pay | Admitting: Hematology and Oncology

## 2023-03-19 DIAGNOSIS — Z961 Presence of intraocular lens: Secondary | ICD-10-CM | POA: Diagnosis not present

## 2023-03-19 DIAGNOSIS — E119 Type 2 diabetes mellitus without complications: Secondary | ICD-10-CM | POA: Diagnosis not present

## 2023-04-06 ENCOUNTER — Other Ambulatory Visit: Payer: Self-pay | Admitting: Hematology and Oncology

## 2023-04-09 DIAGNOSIS — Z170 Estrogen receptor positive status [ER+]: Secondary | ICD-10-CM | POA: Diagnosis not present

## 2023-04-09 DIAGNOSIS — C50512 Malignant neoplasm of lower-outer quadrant of left female breast: Secondary | ICD-10-CM | POA: Diagnosis not present

## 2023-06-17 DIAGNOSIS — L821 Other seborrheic keratosis: Secondary | ICD-10-CM | POA: Diagnosis not present

## 2023-06-17 DIAGNOSIS — L814 Other melanin hyperpigmentation: Secondary | ICD-10-CM | POA: Diagnosis not present

## 2023-06-17 DIAGNOSIS — D1801 Hemangioma of skin and subcutaneous tissue: Secondary | ICD-10-CM | POA: Diagnosis not present

## 2023-06-17 DIAGNOSIS — Z85828 Personal history of other malignant neoplasm of skin: Secondary | ICD-10-CM | POA: Diagnosis not present

## 2023-06-17 DIAGNOSIS — L720 Epidermal cyst: Secondary | ICD-10-CM | POA: Diagnosis not present

## 2023-06-17 DIAGNOSIS — D225 Melanocytic nevi of trunk: Secondary | ICD-10-CM | POA: Diagnosis not present

## 2023-06-17 DIAGNOSIS — L570 Actinic keratosis: Secondary | ICD-10-CM | POA: Diagnosis not present

## 2023-07-29 DIAGNOSIS — N318 Other neuromuscular dysfunction of bladder: Secondary | ICD-10-CM | POA: Diagnosis not present

## 2023-07-29 DIAGNOSIS — E785 Hyperlipidemia, unspecified: Secondary | ICD-10-CM | POA: Diagnosis not present

## 2023-07-29 DIAGNOSIS — R0683 Snoring: Secondary | ICD-10-CM | POA: Diagnosis not present

## 2023-07-29 DIAGNOSIS — C50512 Malignant neoplasm of lower-outer quadrant of left female breast: Secondary | ICD-10-CM | POA: Diagnosis not present

## 2023-07-29 DIAGNOSIS — E1169 Type 2 diabetes mellitus with other specified complication: Secondary | ICD-10-CM | POA: Diagnosis not present

## 2023-07-29 DIAGNOSIS — M8580 Other specified disorders of bone density and structure, unspecified site: Secondary | ICD-10-CM | POA: Diagnosis not present

## 2023-07-29 DIAGNOSIS — R413 Other amnesia: Secondary | ICD-10-CM | POA: Diagnosis not present

## 2023-07-29 DIAGNOSIS — J309 Allergic rhinitis, unspecified: Secondary | ICD-10-CM | POA: Diagnosis not present

## 2023-07-29 DIAGNOSIS — I119 Hypertensive heart disease without heart failure: Secondary | ICD-10-CM | POA: Diagnosis not present

## 2023-07-29 DIAGNOSIS — D126 Benign neoplasm of colon, unspecified: Secondary | ICD-10-CM | POA: Diagnosis not present

## 2023-07-29 DIAGNOSIS — I2510 Atherosclerotic heart disease of native coronary artery without angina pectoris: Secondary | ICD-10-CM | POA: Diagnosis not present

## 2023-08-28 DIAGNOSIS — M5412 Radiculopathy, cervical region: Secondary | ICD-10-CM | POA: Diagnosis not present

## 2023-08-28 DIAGNOSIS — M25512 Pain in left shoulder: Secondary | ICD-10-CM | POA: Diagnosis not present

## 2023-09-04 DIAGNOSIS — M542 Cervicalgia: Secondary | ICD-10-CM | POA: Diagnosis not present

## 2023-09-04 DIAGNOSIS — M5412 Radiculopathy, cervical region: Secondary | ICD-10-CM | POA: Diagnosis not present

## 2023-09-07 DIAGNOSIS — M5412 Radiculopathy, cervical region: Secondary | ICD-10-CM | POA: Diagnosis not present

## 2023-09-07 DIAGNOSIS — M542 Cervicalgia: Secondary | ICD-10-CM | POA: Diagnosis not present

## 2023-09-09 ENCOUNTER — Inpatient Hospital Stay: Payer: Medicare Other | Attending: Hematology and Oncology | Admitting: Hematology and Oncology

## 2023-09-09 VITALS — BP 146/61 | HR 73 | Temp 98.0°F | Resp 18 | Ht 64.0 in | Wt 159.0 lb

## 2023-09-09 DIAGNOSIS — Z853 Personal history of malignant neoplasm of breast: Secondary | ICD-10-CM | POA: Insufficient documentation

## 2023-09-09 DIAGNOSIS — C50512 Malignant neoplasm of lower-outer quadrant of left female breast: Secondary | ICD-10-CM | POA: Insufficient documentation

## 2023-09-09 DIAGNOSIS — C50311 Malignant neoplasm of lower-inner quadrant of right female breast: Secondary | ICD-10-CM

## 2023-09-09 DIAGNOSIS — Z170 Estrogen receptor positive status [ER+]: Secondary | ICD-10-CM | POA: Insufficient documentation

## 2023-09-09 DIAGNOSIS — Z79811 Long term (current) use of aromatase inhibitors: Secondary | ICD-10-CM | POA: Insufficient documentation

## 2023-09-09 DIAGNOSIS — Z923 Personal history of irradiation: Secondary | ICD-10-CM | POA: Diagnosis not present

## 2023-09-09 DIAGNOSIS — Z79899 Other long term (current) drug therapy: Secondary | ICD-10-CM | POA: Insufficient documentation

## 2023-09-09 MED ORDER — ANASTROZOLE 1 MG PO TABS: 1.0000 mg | ORAL_TABLET | Freq: Every day | ORAL | 3 refills | Status: DC

## 2023-09-09 NOTE — Progress Notes (Signed)
Patient Care Team: Rodrigo Ran, MD as PCP - General (Internal Medicine) Althea Charon, MD as Consulting Physician (Radiology) Romine, Edwena Felty, MD as Consulting Physician (Obstetrics and Gynecology) Etter Sjogren, MD as Consulting Physician (Plastic Surgery) Emelia Loron, MD as Consulting Physician (General Surgery) Lonie Peak, MD as Attending Physician (Radiation Oncology) Serena Croissant, MD as Consulting Physician (Hematology and Oncology)  DIAGNOSIS:  Encounter Diagnosis  Name Primary?   Malignant neoplasm of lower-inner quadrant of right breast of female, estrogen receptor positive ([facility]) Yes    SUMMARY OF ONCOLOGIC HISTORY: Oncology History  Breast cancer of lower-inner quadrant of right female breast ([facility])  12/31/2010 Initial Diagnosis   Invasive ductal carcinoma grade 1 ER 90% PR 96% Ki-67 11% HER-2 negative ratio 1.08   01/30/2011 Surgery   Right breast mastectomy: No invasive cancer, high-grade DCIS with comedonecrosis 3 lymph nodes negative: Followed by immediate reconstruction with implant   05/29/2011 - 06/25/2018 Anti-estrogen oral therapy   Aromasin 25 mg daily, due to cost, changed to Arimidex 1 mg daily 12/04/2014   Malignant neoplasm of lower-outer quadrant of left female breast ([facility])  01/02/2020 Initial Diagnosis   Mammogram showed a 1.0cm indeterminate mass in the left breast. Biopsy showed IDC, grade 2, HER-2 equivocal by IHC, negative by FISH, ER+ 100%, PR+ 5%, Ki67 5%   01/02/2020 Cancer Staging   Staging form: Breast, AJCC 8th Edition - Clinical stage from 01/02/2020: Stage IA (cT1b, cN0, cM0, G2, ER+, PR+, HER2-)    01/30/2020 Surgery   Left lumpectomy Dwain Sarna) [phone]): IDC, grade 2, 1.3cm, with intermediate grade DCIS, clear margins, 3 left axillary lymph nodes negative   02/21/2020 Cancer Staging   Staging form: Breast, AJCC 8th Edition - Pathologic stage from 02/21/2020: Stage IA (pT1c, pN0, cM0, G2, ER+, PR+, HER2-)   02/27/2020 -  03/23/2020 Radiation Therapy   The patient initially received a dose of 40.05 Gy in 15 fractions to the breast using whole-breast tangent fields. This was delivered using a 3-D conformal technique. The pt received a boost delivering an additional 10 Gy in 5 fractions using a electron boost with electrons. The total dose was 50.05 Gy.   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  The Common Hereditary Gene Panel offered by [facility] includes sequencing and/or deletion duplication testing of the following 48 genes: APC, ATM, AXIN2, BARD1, BMPR1A, BRCA1, BRCA2, BRIP1, CDH1, CDK4, CDKN2A (p14ARF), CDKN2A (p16INK4a), CHEK2, CTNNA1, DICER1, EPCAM (Deletion/duplication testing only), GREM1 (promoter region deletion/duplication testing only), KIT, MEN1, MLH1, MSH2, MSH3, MSH6, MUTYH, NBN, NF1, NHTL1, PALB2, PDGFRA, PMS2, POLD1, POLE, PTEN, RAD50, RAD51C, RAD51D, RNF43, SDHB, SDHC, SDHD, SMAD4, SMARCA4. STK11, TP53, TSC1, TSC2, and VHL.  The following genes were evaluated for sequence changes only: SDHA and HOXB13 c.251G>A variant only. The report date is February 29, 2020.   03/2020 - 03/2025 Anti-estrogen oral therapy   Anastrozole. Pt took Anastrozole from 11/2014-06/25/2018 and tolerated it well.     CHIEF COMPLIANT: Follow-up with a history of breast cancer  Discussed the use of AI scribe software for clinical note transcription with the patient, who gave verbal consent to proceed.  History of Present Illness   The patient, with a history of breast cancer, has been on anastrozole for nearly four years. She reports persistent warmth, which she attributes to the medication. She denies hot flashes and nocturnal awakenings. She has no breast pain or discomfort and is up-to-date with her mammograms, with the next one due  in December. She had a good summer, despite a planned trip being cancelled due to a fallen tree on her roof and the passing of her sister.       ALLERGIES:  is allergic to ibandronic acid.  MEDICATIONS:  Current Outpatient Medications  Medication Sig Dispense Refill   alendronate (FOSAMAX) 70 MG tablet Take 70 mg by mouth every 30 (thirty) days. Take with a full glass of water on an empty stomach.     aspirin 81 MG chewable tablet Chew 81 mg by mouth 2 (two) times a week.      atorvastatin (LIPITOR) 80 MG tablet Take 40 mg by mouth daily.      Calcium Carbonate-Vitamin D (CALCIUM 600 + D PO) Take by mouth daily.      Cholecalciferol (VITAMIN D) 1000 UNITS capsule Take 5,000 Units by mouth. Every other week     ezetimibe (ZETIA) 10 MG tablet TAKE 1 TABLET EVERY DAY for 90     fexofenadine (ALLEGRA) 30 MG tablet Take 30 mg by mouth as needed.      Multiple Vitamin (MULTIVITAMIN) capsule Take 1 capsule by mouth daily.     Omega-3 Fatty Acids (OMEGA-3 FISH OIL) 1200 MG CAPS Take 2 capsules by mouth daily.      TRIMETHOPRIM PO Take 100 mg by mouth as needed.      valsartan (DIOVAN) 160 MG tablet Take 1 tablet (160 mg total) by mouth daily. (Patient taking differently: Take 320 mg by mouth daily.)     anastrozole (ARIMIDEX) 1 MG tablet Take 1 tablet (1 mg total) by mouth daily. 90 tablet 3   No current facility-administered medications for this visit.    PHYSICAL EXAMINATION: ECOG PERFORMANCE STATUS: 0 - Asymptomatic  Vitals:   09/09/23 1017  BP: (!) 146/61  Pulse: 73  Resp: 18  Temp: 98 F (36.7 C)  SpO2: 99%   Filed Weights   09/09/23 1017  Weight: 159 lb (72.1 kg)    BREAST: No palpable masses or nodules in either right or left breasts. No palpable axillary supraclavicular or infraclavicular adenopathy no breast tenderness or nipple discharge. (exam performed in the presence of a chaperone)  LABORATORY DATA:  I have reviewed the data as listed    Latest Ref Rng & Units 09/04/2014    1:36 PM 12/01/2013   10:39 AM 06/09/2013   10:24 AM  CMP  Glucose 70 - 140 mg/dl 696  295  284   BUN 7.0 - 26.0 mg/dL 13.2   44.0  10.2   Creatinine 0.6 - 1.1 mg/dL 0.8  0.8  0.7   Sodium 136 - 145 mEq/L 141  141  140   Potassium 3.5 - 5.1 mEq/L 4.6  4.0  4.3   Chloride 98 - 107 mEq/L   105   CO2 22 - 29 mEq/L 27  24  25    Calcium 8.4 - 10.4 mg/dL 72.5  36.6  9.9   Total Protein 6.4 - 8.3 g/dL 7.6  7.4  7.5   Total Bilirubin 0.20 - 1.20 mg/dL 4.40  3.47  4.25   Alkaline Phos 40 - 150 U/L 80  79  79   AST 5 - 34 U/L 21  29  20    ALT 0 - 55 U/L 26  43  29     Lab Results  Component Value Date   WBC 8.9 09/04/2014   HGB 16.4 (H) 09/04/2014   HCT 49.3 (H) 09/04/2014   MCV 89.7 09/04/2014  PLT 224 09/04/2014   NEUTROABS 5.6 09/04/2014    ASSESSMENT & PLAN:  Breast cancer of lower-inner quadrant of right female breast ([facility]) Stage I invasive ductal carcinoma with DCIS right breast: Initial biopsy showed invasive ductal carcinoma of the final mastectomy only showed DCIS ER/PR positive HER-2 negative: Switched from Aromasin to anastrozole December 2015-2019 Left Breast cancer 2021   Breast cancer of lower-inner quadrant of right female breast ([facility]) Mammogram done at [facility]: 12/27/2019: 1 cm lobulated mass with indistinct margin in the left breast lower outer quadrant Ultrasound-guided biopsy: Grade 2 IDC ER 100%, PR 5%, Ki-67 5%, HER-2 negative ratio 1.19   T1BN0 stage Ia 01/30/20: Left lumpectomy Dwain Sarna): IDC, grade 2, 1.3cm, with intermediate grade DCIS, clear margins, 3 left axillary lymph nodes negative Adjuvant radiation 02/28/2020-03/23/2020    Treatment plan: Adjuvant antiestrogen therapy with anastrozole 1 mg daily Anastrozole toxicities: Denies any adverse effects of anastrozole therapy   Breast cancer surveillance: 1.  Breast exam 09/09/2023: Benign 2. mammogram 02/11/2023: [facility] benign breast density category B   Return to clinic in 1 year for follow-up   No orders of the defined types were placed in this encounter.  The patient has a good understanding of the overall plan. she agrees with it.  she will call with any problems that may develop before the next visit here. Total time spent: 30 mins including face to face time and time spent for planning, charting and co-ordination of care   Tamsen Meek, MD 09/09/23

## 2023-09-09 NOTE — Assessment & Plan Note (Signed)
Stage I invasive ductal carcinoma with DCIS right breast: Initial biopsy showed invasive ductal carcinoma of the final mastectomy only showed DCIS ER/PR positive HER-2 negative: Switched from Aromasin to anastrozole December 2015-2019 Left Breast cancer 2021   Breast cancer of lower-inner quadrant of right female breast ([facility]) Mammogram done at [facility]: 12/27/2019: 1 cm lobulated mass with indistinct margin in the left breast lower outer quadrant Ultrasound-guided biopsy: Grade 2 IDC ER 100%, PR 5%, Ki-67 5%, HER-2 negative ratio 1.19   T1BN0 stage Ia 01/30/20: Left lumpectomy Dwain Sarna): IDC, grade 2, 1.3cm, with intermediate grade DCIS, clear margins, 3 left axillary lymph nodes negative Adjuvant radiation 02/28/2020-03/23/2020    Treatment plan: Adjuvant antiestrogen therapy with anastrozole 1 mg daily Anastrozole toxicities: Denies any adverse effects of anastrozole therapy   Breast cancer surveillance: 1.  Breast exam 09/09/2023: Benign 2. mammogram 02/11/2023: [facility] benign breast density category B   Return to clinic in 1 year for follow-up

## 2023-09-14 DIAGNOSIS — M542 Cervicalgia: Secondary | ICD-10-CM | POA: Diagnosis not present

## 2023-09-14 DIAGNOSIS — M5412 Radiculopathy, cervical region: Secondary | ICD-10-CM | POA: Diagnosis not present

## 2023-09-21 DIAGNOSIS — M5412 Radiculopathy, cervical region: Secondary | ICD-10-CM | POA: Diagnosis not present

## 2023-09-21 DIAGNOSIS — M542 Cervicalgia: Secondary | ICD-10-CM | POA: Diagnosis not present

## 2023-10-07 DIAGNOSIS — M5412 Radiculopathy, cervical region: Secondary | ICD-10-CM | POA: Diagnosis not present

## 2023-10-07 DIAGNOSIS — M542 Cervicalgia: Secondary | ICD-10-CM | POA: Diagnosis not present

## 2023-10-09 DIAGNOSIS — M5412 Radiculopathy, cervical region: Secondary | ICD-10-CM | POA: Diagnosis not present

## 2023-10-15 DIAGNOSIS — M5412 Radiculopathy, cervical region: Secondary | ICD-10-CM | POA: Diagnosis not present

## 2023-10-15 DIAGNOSIS — M542 Cervicalgia: Secondary | ICD-10-CM | POA: Diagnosis not present

## 2023-10-21 DIAGNOSIS — M542 Cervicalgia: Secondary | ICD-10-CM | POA: Diagnosis not present

## 2023-10-21 DIAGNOSIS — M5412 Radiculopathy, cervical region: Secondary | ICD-10-CM | POA: Diagnosis not present

## 2023-10-28 DIAGNOSIS — M5412 Radiculopathy, cervical region: Secondary | ICD-10-CM | POA: Diagnosis not present

## 2023-10-28 DIAGNOSIS — M542 Cervicalgia: Secondary | ICD-10-CM | POA: Diagnosis not present

## 2023-10-29 DIAGNOSIS — Z23 Encounter for immunization: Secondary | ICD-10-CM | POA: Diagnosis not present

## 2024-01-06 DIAGNOSIS — H04123 Dry eye syndrome of bilateral lacrimal glands: Secondary | ICD-10-CM | POA: Diagnosis not present

## 2024-01-06 DIAGNOSIS — H0011 Chalazion right upper eyelid: Secondary | ICD-10-CM | POA: Diagnosis not present

## 2024-02-14 ENCOUNTER — Emergency Department (HOSPITAL_BASED_OUTPATIENT_CLINIC_OR_DEPARTMENT_OTHER): Payer: Medicare Other

## 2024-02-14 ENCOUNTER — Other Ambulatory Visit: Payer: Self-pay

## 2024-02-14 ENCOUNTER — Encounter (HOSPITAL_BASED_OUTPATIENT_CLINIC_OR_DEPARTMENT_OTHER): Payer: Self-pay

## 2024-02-14 ENCOUNTER — Emergency Department (HOSPITAL_BASED_OUTPATIENT_CLINIC_OR_DEPARTMENT_OTHER)
Admission: EM | Admit: 2024-02-14 | Discharge: 2024-02-14 | Disposition: A | Discharge status: Home / Self Care | Payer: Medicare Other | Attending: Emergency Medicine | Admitting: Emergency Medicine

## 2024-02-14 DIAGNOSIS — Z853 Personal history of malignant neoplasm of breast: Secondary | ICD-10-CM | POA: Diagnosis not present

## 2024-02-14 DIAGNOSIS — Z79899 Other long term (current) drug therapy: Secondary | ICD-10-CM | POA: Diagnosis not present

## 2024-02-14 DIAGNOSIS — I10 Essential (primary) hypertension: Secondary | ICD-10-CM | POA: Insufficient documentation

## 2024-02-14 DIAGNOSIS — E119 Type 2 diabetes mellitus without complications: Secondary | ICD-10-CM | POA: Diagnosis not present

## 2024-02-14 DIAGNOSIS — J101 Influenza due to other identified influenza virus with other respiratory manifestations: Secondary | ICD-10-CM | POA: Diagnosis not present

## 2024-02-14 DIAGNOSIS — R110 Nausea: Secondary | ICD-10-CM | POA: Diagnosis not present

## 2024-02-14 DIAGNOSIS — Z7982 Long term (current) use of aspirin: Secondary | ICD-10-CM | POA: Insufficient documentation

## 2024-02-14 DIAGNOSIS — R059 Cough, unspecified: Secondary | ICD-10-CM | POA: Diagnosis not present

## 2024-02-14 DIAGNOSIS — J111 Influenza due to unidentified influenza virus with other respiratory manifestations: Secondary | ICD-10-CM

## 2024-02-14 DIAGNOSIS — R509 Fever, unspecified: Secondary | ICD-10-CM | POA: Diagnosis not present

## 2024-02-14 DIAGNOSIS — R5383 Other fatigue: Secondary | ICD-10-CM | POA: Diagnosis not present

## 2024-02-14 LAB — CBC WITH DIFFERENTIAL/PLATELET
Abs Immature Granulocytes: 0.04 10*3/uL (ref 0.00–0.07)
Basophils Absolute: 0.1 10*3/uL (ref 0.0–0.1)
Basophils Relative: 1 %
Eosinophils Absolute: 0 10*3/uL (ref 0.0–0.5)
Eosinophils Relative: 0 %
HCT: 42.5 % (ref 36.0–46.0)
Hemoglobin: 14.5 g/dL (ref 12.0–15.0)
Immature Granulocytes: 1 %
Lymphocytes Relative: 9 %
Lymphs Abs: 0.8 10*3/uL (ref 0.7–4.0)
MCH: 30.1 pg (ref 26.0–34.0)
MCHC: 34.1 g/dL (ref 30.0–36.0)
MCV: 88.4 fL (ref 80.0–100.0)
Monocytes Absolute: 1 10*3/uL (ref 0.1–1.0)
Monocytes Relative: 11 %
Neutro Abs: 6.6 10*3/uL (ref 1.7–7.7)
Neutrophils Relative %: 78 %
Platelets: 181 10*3/uL (ref 150–400)
RBC: 4.81 MIL/uL (ref 3.87–5.11)
RDW: 12.5 % (ref 11.5–15.5)
WBC: 8.5 10*3/uL (ref 4.0–10.5)
nRBC: 0 % (ref 0.0–0.2)

## 2024-02-14 LAB — COMPREHENSIVE METABOLIC PANEL
ALT: 40 U/L (ref 0–44)
AST: 35 U/L (ref 15–41)
Albumin: 4.5 g/dL (ref 3.5–5.0)
Alkaline Phosphatase: 63 U/L (ref 38–126)
Anion gap: 12 (ref 5–15)
BUN: 12 mg/dL (ref 8–23)
CO2: 23 mmol/L (ref 22–32)
Calcium: 9 mg/dL (ref 8.9–10.3)
Chloride: 99 mmol/L (ref 98–111)
Creatinine, Ser: 0.72 mg/dL (ref 0.44–1.00)
GFR, Estimated: >60 mL/min (ref >60)
Glucose, Bld: 171 mg/dL — ABNORMAL HIGH (ref 70–99)
Potassium: 3.9 mmol/L (ref 3.5–5.1)
Sodium: 134 mmol/L — ABNORMAL LOW (ref 135–145)
Total Bilirubin: 1.5 mg/dL — ABNORMAL HIGH (ref 0.0–1.2)
Total Protein: 7.3 g/dL (ref 6.5–8.1)

## 2024-02-14 LAB — RESP PANEL BY RT-PCR (RSV, FLU A&B, COVID)  RVPGX2
Influenza A by PCR: POSITIVE — AB
Influenza B by PCR: NEGATIVE
Resp Syncytial Virus by PCR: NEGATIVE
SARS Coronavirus 2 by RT PCR: NEGATIVE

## 2024-02-14 MED ORDER — ONDANSETRON 4 MG PO TBDP: 4.0000 mg | ORAL_TABLET | Freq: Three times a day (TID) | ORAL | 0 refills | Status: DC | PRN

## 2024-02-14 MED ORDER — IPRATROPIUM-ALBUTEROL 0.5-2.5 (3) MG/3ML IN SOLN
3.0000 mL | Freq: Once | RESPIRATORY_TRACT | Status: AC
  Administered 2024-02-14: 3 mL via RESPIRATORY_TRACT
  Filled 2024-02-14: qty 3

## 2024-02-14 MED ORDER — BENZONATATE 100 MG PO CAPS: 100.0000 mg | ORAL_CAPSULE | Freq: Three times a day (TID) | ORAL | 0 refills | Status: DC

## 2024-02-14 MED ORDER — ONDANSETRON 4 MG PO TBDP
4.0000 mg | ORAL_TABLET | Freq: Once | ORAL | Status: AC | PRN
  Administered 2024-02-14: 4 mg via ORAL
  Filled 2024-02-14: qty 1

## 2024-02-14 MED ORDER — ACETAMINOPHEN 325 MG PO TABS
650.0000 mg | ORAL_TABLET | Freq: Once | ORAL | Status: AC | PRN
  Administered 2024-02-14: 650 mg via ORAL
  Filled 2024-02-14: qty 2

## 2024-02-14 MED ORDER — ALBUTEROL SULFATE HFA 108 (90 BASE) MCG/ACT IN AERS
1.0000 | INHALATION_SPRAY | Freq: Once | RESPIRATORY_TRACT | Status: AC
  Administered 2024-02-14: 1 via RESPIRATORY_TRACT
  Filled 2024-02-14: qty 6.7

## 2024-02-14 MED ORDER — SODIUM CHLORIDE 0.9 % IV BOLUS
500.0000 mL | Freq: Once | INTRAVENOUS | Status: AC
  Administered 2024-02-14: 500 mL via INTRAVENOUS

## 2024-02-14 MED ORDER — AMOXICILLIN-POT CLAVULANATE 875-125 MG PO TABS: 1.0000 | ORAL_TABLET | Freq: Two times a day (BID) | ORAL | 0 refills | Status: DC

## 2024-02-14 MED ORDER — AMOXICILLIN-POT CLAVULANATE 875-125 MG PO TABS
1.0000 | ORAL_TABLET | Freq: Once | ORAL | Status: DC
  Filled 2024-02-14: qty 1

## 2024-02-14 MED ORDER — IBUPROFEN 800 MG PO TABS
800.0000 mg | ORAL_TABLET | Freq: Once | ORAL | Status: AC
  Administered 2024-02-14: 800 mg via ORAL
  Filled 2024-02-14: qty 1

## 2024-02-14 NOTE — ED Provider Notes (Signed)
 [place] EMERGENCY DEPARTMENT AT [facility] Provider Note   CSN: [id number] Arrival date & time: 02/14/24  1857     History  Chief Complaint  Patient presents with   Cough   Nausea    Breanna Neal is a 78 y.o. female with past medical history of diabetes, recurrent UTIs, breast cancer, hypertension, osteopenia reporting to emergency room with complaint of flu like symptoms for 4 days.  Patient reports cough congestion muscle aches fever nausea vomiting and diarrhea.  Patient reports she is starting to feel generalized weakness as well.  She thinks this patient eaten anything for 24 hours.  Denies chest pain shortness of breath.   Cough      Home Medications Prior to Admission medications   Medication Sig Start Date End Date Taking? Authorizing Provider  alendronate (FOSAMAX) 70 MG tablet Take 70 mg by mouth every 30 (thirty) days. Take with a full glass of water on an empty stomach.    [provider]  anastrozole (ARIMIDEX) 1 MG tablet Take 1 tablet (1 mg total) by mouth daily. 09/09/23   Serena Croissant, MD  aspirin 81 MG chewable tablet Chew 81 mg by mouth 2 (two) times a week.     [provider]  atorvastatin (LIPITOR) 80 MG tablet Take 40 mg by mouth daily.     [provider]  Calcium Carbonate-Vitamin D (CALCIUM 600 + D PO) Take by mouth daily.     [provider]  Cholecalciferol (VITAMIN D) 1000 UNITS capsule Take 5,000 Units by mouth. Every other week 06/15/12   [provider]  ezetimibe (ZETIA) 10 MG tablet TAKE 1 TABLET EVERY DAY for 90 03/17/22   [provider]  fexofenadine (ALLEGRA) 30 MG tablet Take 30 mg by mouth as needed.     [provider]  Multiple Vitamin (MULTIVITAMIN) capsule Take 1 capsule by mouth daily.    [provider]  Omega-3 Fatty Acids (OMEGA-3 FISH OIL) 1200 MG CAPS Take 2 capsules by mouth daily.     [provider]  TRIMETHOPRIM PO Take 100 mg by  mouth as needed.     [provider]  valsartan (DIOVAN) 160 MG tablet Take 1 tablet (160 mg total) by mouth daily. Patient taking differently: Take 320 mg by mouth daily. 06/25/18   Serena Croissant, MD      Allergies    Ibandronate    Review of Systems   Review of Systems  Respiratory:  Positive for cough.     Physical Exam Updated Vital Signs BP 136/65   Pulse 95   Temp (!) 101.6 F (38.7 C) (Oral)   Resp 20   Ht 5\' 4"  (1.626 m)   Wt 71.7 kg   SpO2 97%   BMI 27.12 kg/m  Physical Exam Vitals and nursing note reviewed.  Constitutional:      General: She is not in acute distress.    Appearance: She is not toxic-appearing.  HENT:     Head: Normocephalic and atraumatic.  Eyes:     General: No scleral icterus.    Conjunctiva/sclera: Conjunctivae normal.  Cardiovascular:     Rate and Rhythm: Normal rate and regular rhythm.     Pulses: Normal pulses.     Heart sounds: Normal heart sounds.  Pulmonary:     Effort: Pulmonary effort is normal. No respiratory distress.     Breath sounds: Normal breath sounds.  Abdominal:     General: Abdomen is flat. Bowel sounds  are normal.     Palpations: Abdomen is soft.     Tenderness: There is no abdominal tenderness.  Musculoskeletal:     Right lower leg: No edema.     Left lower leg: No edema.  Skin:    General: Skin is warm and dry.     Findings: No lesion.  Neurological:     General: No focal deficit present.     Mental Status: She is alert and oriented to person, place, and time. Mental status is at baseline.     ED Results / Procedures / Treatments   Labs (all labs ordered are listed, but only abnormal results are displayed) Labs Reviewed  RESP PANEL BY RT-PCR (RSV, FLU A&B, COVID)  RVPGX2 - Abnormal; Notable for the following components:      Result Value   Influenza A by PCR POSITIVE (*)    All other components within normal limits  COMPREHENSIVE METABOLIC PANEL - Abnormal; Notable for the following components:    Sodium 134 (*)    Glucose, Bld 171 (*)    Total Bilirubin 1.5 (*)    All other components within normal limits  CBC WITH DIFFERENTIAL/PLATELET    EKG None  Radiology DG Chest Portable 1 View Result Date: 02/14/2024 CLINICAL DATA:  Cough, fever, nausea, fatigue EXAM: PORTABLE CHEST 1 VIEW COMPARISON:  01/28/2011 FINDINGS: Heart and mediastinal contours are within normal limits. No focal opacities or effusions. No acute bony abnormality. IMPRESSION: No active disease. Electronically Signed   By: Charlett Nose M.D.   On: 02/14/2024 20:31    Procedures Procedures    Medications Ordered in ED Medications  ipratropium-albuterol (DUONEB) 0.5-2.5 (3) MG/3ML nebulizer solution 3 mL (has no administration in time range)  sodium chloride 0.9 % bolus 500 mL (has no administration in time range)  ibuprofen (ADVIL) tablet 800 mg (has no administration in time range)  ondansetron (ZOFRAN-ODT) disintegrating tablet 4 mg (4 mg Oral Given 02/14/24 1947)  acetaminophen (TYLENOL) tablet 650 mg (650 mg Oral Given 02/14/24 1947)    ED Course/ Medical Decision Making/ A&P                                 Medical Decision Making Amount and/or Complexity of Data Reviewed Labs: ordered. Radiology: ordered.  Risk OTC drugs. Prescription drug management.   Breanna Neal 78 y.o. presented today for URI like symptoms. Working DDx that I considered at this time includes, but not limited to, viral illness, pharyngitis, mono, sinusitis, electrolyte abnormality, AOM.  R/o DDx: these additional diagnoses are not consistent with patient's history, presentation, physical exam, labs/imaging findings.  CBC without leukocytosis and no anemia.  CMP with sodium of 134 however normal kidney function no acute kidney injury.  She did test positive for influenza A.  Chest x-ray without any pneumonia  Problem List / ED Course / Critical interventions / Medication management  Reporting to emergency room with 4  days of flulike symptoms.  Patient tested positive for influenza A.  Her chest x-ray is within normal limits and she has reassuring lab work as well.  When she originally reported to emergency room she was 91% on room air and put on oxygen approximately 2 L nasal cannula.  She did not have any associated shortness of breath or wheezing.  When I saw her I removed nasal cannula and she was satting fine at 95% on room air.  I did give  DuoNeb and have her ambulate with pulse ox.  While she was ambulating she primarily stated in mid 90s however she did have an episode where that of her ambulation where she dropped to 89%.  Patient reports she did not have any associated shortness of breath.  She is feeling much better since she received the fluid and Tylenol and she is requesting to go.  I did discuss staying in the hospital however patient declined.  Discussed case with my attending Doctor Who agreed to see patient as well who agrees patient is stable for discharge.  We discussed in detail the risks of going home and patient still feels she would like to go home.  Given strict return precautions. I ordered medication including Zofran, Tylenol, NS Reevaluation of the patient after these medicines showed that the patient improved Patients vitals assessed. Upon arrival patient is hemodynamically stable.  I have reviewed the patients home medicines and have made adjustments as needed     Plan:  F/u w/ PCP in 2-3d to ensure resolution of sx.  Patient was given return precautions. Patient stable for discharge at this time.  Patient educated on sx and dx and verbalized understanding of plan. Return to ER if new or worsening sx.          Final Clinical Impression(s) / ED Diagnoses Final diagnoses:  Influenza    Rx / DC Orders ED Discharge Orders     None         Smitty Knudsen, PA-C 02/14/24 2237    Royanne Foots, DO 02/15/24 1726

## 2024-02-14 NOTE — Discharge Instructions (Addendum)
 Please return to emergency room if you have any new or worsening symptoms.  Please return to emergency room if you are feeling weakness, shortness of breath or having difficulty walking.  Please follow-up with your primary care doctor as discussed.

## 2024-02-14 NOTE — ED Triage Notes (Signed)
 Patient arrives with complaints of nausea, cough, and fatigue x3 days. Generalized body pain as well.

## 2024-02-14 NOTE — ED Notes (Signed)
 Pt placed back on 1lt [place] due to SATS of 90%

## 2024-02-14 NOTE — ED Notes (Signed)
 Trial of ambulation with pulse ox. Patient's sat dropped 88% RA. Provider made aware.

## 2024-02-16 DIAGNOSIS — M8580 Other specified disorders of bone density and structure, unspecified site: Secondary | ICD-10-CM | POA: Diagnosis not present

## 2024-02-16 DIAGNOSIS — E785 Hyperlipidemia, unspecified: Secondary | ICD-10-CM | POA: Diagnosis not present

## 2024-02-16 DIAGNOSIS — Z1212 Encounter for screening for malignant neoplasm of rectum: Secondary | ICD-10-CM | POA: Diagnosis not present

## 2024-02-16 DIAGNOSIS — E1169 Type 2 diabetes mellitus with other specified complication: Secondary | ICD-10-CM | POA: Diagnosis not present

## 2024-02-19 DIAGNOSIS — R197 Diarrhea, unspecified: Secondary | ICD-10-CM | POA: Diagnosis not present

## 2024-02-19 DIAGNOSIS — E785 Hyperlipidemia, unspecified: Secondary | ICD-10-CM | POA: Diagnosis not present

## 2024-02-19 DIAGNOSIS — I119 Hypertensive heart disease without heart failure: Secondary | ICD-10-CM | POA: Diagnosis not present

## 2024-02-19 DIAGNOSIS — I2510 Atherosclerotic heart disease of native coronary artery without angina pectoris: Secondary | ICD-10-CM | POA: Diagnosis not present

## 2024-02-19 DIAGNOSIS — I10 Essential (primary) hypertension: Secondary | ICD-10-CM | POA: Diagnosis not present

## 2024-02-19 DIAGNOSIS — I517 Cardiomegaly: Secondary | ICD-10-CM | POA: Diagnosis not present

## 2024-02-19 DIAGNOSIS — J101 Influenza due to other identified influenza virus with other respiratory manifestations: Secondary | ICD-10-CM | POA: Diagnosis not present

## 2024-02-19 DIAGNOSIS — K582 Mixed irritable bowel syndrome: Secondary | ICD-10-CM | POA: Diagnosis not present

## 2024-02-19 DIAGNOSIS — E1169 Type 2 diabetes mellitus with other specified complication: Secondary | ICD-10-CM | POA: Diagnosis not present

## 2024-02-23 DIAGNOSIS — C50512 Malignant neoplasm of lower-outer quadrant of left female breast: Secondary | ICD-10-CM | POA: Diagnosis not present

## 2024-02-23 DIAGNOSIS — Z0000 Encounter for general adult medical examination without abnormal findings: Secondary | ICD-10-CM | POA: Diagnosis not present

## 2024-02-23 DIAGNOSIS — M8580 Other specified disorders of bone density and structure, unspecified site: Secondary | ICD-10-CM | POA: Diagnosis not present

## 2024-02-23 DIAGNOSIS — M5416 Radiculopathy, lumbar region: Secondary | ICD-10-CM | POA: Diagnosis not present

## 2024-02-23 DIAGNOSIS — R413 Other amnesia: Secondary | ICD-10-CM | POA: Diagnosis not present

## 2024-02-23 DIAGNOSIS — K582 Mixed irritable bowel syndrome: Secondary | ICD-10-CM | POA: Diagnosis not present

## 2024-02-23 DIAGNOSIS — R82998 Other abnormal findings in urine: Secondary | ICD-10-CM | POA: Diagnosis not present

## 2024-02-23 DIAGNOSIS — I2510 Atherosclerotic heart disease of native coronary artery without angina pectoris: Secondary | ICD-10-CM | POA: Diagnosis not present

## 2024-02-23 DIAGNOSIS — E1169 Type 2 diabetes mellitus with other specified complication: Secondary | ICD-10-CM | POA: Diagnosis not present

## 2024-02-23 DIAGNOSIS — J309 Allergic rhinitis, unspecified: Secondary | ICD-10-CM | POA: Diagnosis not present

## 2024-02-23 DIAGNOSIS — R197 Diarrhea, unspecified: Secondary | ICD-10-CM | POA: Diagnosis not present

## 2024-02-23 DIAGNOSIS — E785 Hyperlipidemia, unspecified: Secondary | ICD-10-CM | POA: Diagnosis not present

## 2024-02-23 DIAGNOSIS — I119 Hypertensive heart disease without heart failure: Secondary | ICD-10-CM | POA: Diagnosis not present

## 2024-02-23 DIAGNOSIS — I10 Essential (primary) hypertension: Secondary | ICD-10-CM | POA: Diagnosis not present

## 2024-03-01 ENCOUNTER — Other Ambulatory Visit: Payer: Self-pay | Admitting: Hematology and Oncology

## 2024-03-07 DIAGNOSIS — Z1231 Encounter for screening mammogram for malignant neoplasm of breast: Secondary | ICD-10-CM | POA: Diagnosis not present

## 2024-03-10 ENCOUNTER — Encounter: Payer: Self-pay | Admitting: Hematology and Oncology

## 2024-03-11 DIAGNOSIS — Z1211 Encounter for screening for malignant neoplasm of colon: Secondary | ICD-10-CM | POA: Diagnosis not present

## 2024-03-21 DIAGNOSIS — H04123 Dry eye syndrome of bilateral lacrimal glands: Secondary | ICD-10-CM | POA: Diagnosis not present

## 2024-04-19 DIAGNOSIS — C50512 Malignant neoplasm of lower-outer quadrant of left female breast: Secondary | ICD-10-CM | POA: Diagnosis not present

## 2024-04-19 DIAGNOSIS — Z170 Estrogen receptor positive status [ER+]: Secondary | ICD-10-CM | POA: Diagnosis not present

## 2024-05-05 DIAGNOSIS — E1169 Type 2 diabetes mellitus with other specified complication: Secondary | ICD-10-CM | POA: Diagnosis not present

## 2024-05-10 DIAGNOSIS — M79644 Pain in right finger(s): Secondary | ICD-10-CM | POA: Diagnosis not present

## 2024-05-10 DIAGNOSIS — L03011 Cellulitis of right finger: Secondary | ICD-10-CM | POA: Diagnosis not present

## 2024-06-17 DIAGNOSIS — L821 Other seborrheic keratosis: Secondary | ICD-10-CM | POA: Diagnosis not present

## 2024-06-17 DIAGNOSIS — D2272 Melanocytic nevi of left lower limb, including hip: Secondary | ICD-10-CM | POA: Diagnosis not present

## 2024-06-17 DIAGNOSIS — L570 Actinic keratosis: Secondary | ICD-10-CM | POA: Diagnosis not present

## 2024-06-17 DIAGNOSIS — Z85828 Personal history of other malignant neoplasm of skin: Secondary | ICD-10-CM | POA: Diagnosis not present

## 2024-06-17 DIAGNOSIS — D1801 Hemangioma of skin and subcutaneous tissue: Secondary | ICD-10-CM | POA: Diagnosis not present

## 2024-06-17 DIAGNOSIS — D2271 Melanocytic nevi of right lower limb, including hip: Secondary | ICD-10-CM | POA: Diagnosis not present

## 2024-06-17 DIAGNOSIS — L814 Other melanin hyperpigmentation: Secondary | ICD-10-CM | POA: Diagnosis not present

## 2024-09-08 ENCOUNTER — Inpatient Hospital Stay: Payer: BLUE CROSS/BLUE SHIELD | Admitting: Hematology and Oncology

## 2024-09-22 ENCOUNTER — Inpatient Hospital Stay: Attending: Hematology and Oncology | Admitting: Hematology and Oncology

## 2024-09-22 VITALS — BP 145/65 | HR 78 | Temp 98.0°F | Resp 16 | Ht 64.0 in | Wt 149.2 lb

## 2024-09-22 DIAGNOSIS — C50311 Malignant neoplasm of lower-inner quadrant of right female breast: Secondary | ICD-10-CM | POA: Diagnosis not present

## 2024-09-22 DIAGNOSIS — Z79811 Long term (current) use of aromatase inhibitors: Secondary | ICD-10-CM | POA: Insufficient documentation

## 2024-09-22 DIAGNOSIS — Z170 Estrogen receptor positive status [ER+]: Secondary | ICD-10-CM | POA: Insufficient documentation

## 2024-09-22 DIAGNOSIS — Z1721 Progesterone receptor positive status: Secondary | ICD-10-CM | POA: Insufficient documentation

## 2024-09-22 DIAGNOSIS — Z9011 Acquired absence of right breast and nipple: Secondary | ICD-10-CM | POA: Insufficient documentation

## 2024-09-22 DIAGNOSIS — C50512 Malignant neoplasm of lower-outer quadrant of left female breast: Secondary | ICD-10-CM | POA: Diagnosis not present

## 2024-09-22 DIAGNOSIS — Z79899 Other long term (current) drug therapy: Secondary | ICD-10-CM | POA: Insufficient documentation

## 2024-09-22 DIAGNOSIS — Z923 Personal history of irradiation: Secondary | ICD-10-CM | POA: Insufficient documentation

## 2024-09-22 DIAGNOSIS — Z853 Personal history of malignant neoplasm of breast: Secondary | ICD-10-CM | POA: Insufficient documentation

## 2024-09-22 DIAGNOSIS — Z1732 Human epidermal growth factor receptor 2 negative status: Secondary | ICD-10-CM | POA: Insufficient documentation

## 2024-09-22 MED ORDER — TURMERIC 500 MG PO CAPS: 1.0000 | ORAL_CAPSULE | Freq: Every day | ORAL | Status: AC

## 2024-09-22 MED ORDER — MAGNESIUM 400 MG PO TABS: 1.0000 | ORAL_TABLET | Freq: Every day | ORAL | Status: AC

## 2024-09-22 NOTE — Assessment & Plan Note (Signed)
 Stage I invasive ductal carcinoma with DCIS right breast: Initial biopsy showed invasive ductal carcinoma of the final mastectomy only showed DCIS ER/PR positive HER-2 negative: Switched from Aromasin  to anastrozole  December 2015-2019 Left Breast cancer 2021   Breast cancer of lower-inner quadrant of right female breast (HCC) Mammogram done at [facility]: 12/27/2019: 1 cm lobulated mass with indistinct margin in the left breast lower outer quadrant Ultrasound-guided biopsy: Grade 2 IDC ER 100%, PR 5%, Ki-67 5%, HER-2 negative ratio 1.19   T1BN0 stage Ia 01/30/20: Left lumpectomy [facility]): IDC, grade 2, 1.3cm, with intermediate grade DCIS, clear margins, 3 left axillary lymph nodes negative Adjuvant radiation 02/28/2020-03/23/2020    Treatment plan: Adjuvant antiestrogen therapy with anastrozole  1 mg daily Anastrozole  toxicities: Denies any adverse effects of anastrozole  therapy   Breast cancer surveillance: 1.  Breast exam 09/22/2024: Benign 2. mammogram 03/07/2024: [facility] benign breast density category B   Return to clinic in 1 year for follow-up

## 2024-09-22 NOTE — Progress Notes (Signed)
 Patient Care Team: Shayne Anes, MD as PCP - General (Internal Medicine) Ardeen Rollene Kass, MD as Consulting Physician (Radiology) Romine, Montie SQUIBB, MD as Consulting Physician (Obstetrics and Gynecology) Leora Lenis, MD as Consulting Physician (Plastic Surgery) Ebbie Cough, MD as Consulting Physician (General Surgery) Izell Domino, MD as Attending Physician (Radiation Oncology) Odean Potts, MD as Consulting Physician (Hematology and Oncology)  DIAGNOSIS:  Encounter Diagnosis  Name Primary?   Malignant neoplasm of lower-inner quadrant of right breast of female, estrogen receptor positive (HCC) Yes    SUMMARY OF ONCOLOGIC HISTORY: Oncology History  Breast cancer of lower-inner quadrant of right female breast (HCC)  12/31/2010 Initial Diagnosis   Invasive ductal carcinoma grade 1 ER 90% PR 96% Ki-67 11% HER-2 negative ratio 1.08   01/30/2011 Surgery   Right breast mastectomy: No invasive cancer, high-grade DCIS with comedonecrosis 3 lymph nodes negative: Followed by immediate reconstruction with implant   05/29/2011 - 06/25/2018 Anti-estrogen oral therapy   Aromasin  25 mg daily, due to cost, changed to Arimidex  1 mg daily 12/04/2014   Malignant neoplasm of lower-outer quadrant of left female breast (HCC)  01/02/2020 Initial Diagnosis   Mammogram showed a 1.0cm indeterminate mass in the left breast. Biopsy showed IDC, grade 2, HER-2 equivocal by IHC, negative by FISH, ER+ 100%, PR+ 5%, Ki67 5%   01/02/2020 Cancer Staging   Staging form: Breast, AJCC 8th Edition - Clinical stage from 01/02/2020: Stage IA (cT1b, cN0, cM0, G2, ER+, PR+, HER2-)    01/30/2020 Surgery   Left lumpectomy [facility]) [phone]): IDC, grade 2, 1.3cm, with intermediate grade DCIS, clear margins, 3 left axillary lymph nodes negative   02/21/2020 Cancer Staging   Staging form: Breast, AJCC 8th Edition - Pathologic stage from 02/21/2020: Stage IA (pT1c, pN0, cM0, G2, ER+, PR+, HER2-)   02/27/2020 -  03/23/2020 Radiation Therapy   The patient initially received a dose of 40.05 Gy in 15 fractions to the breast using whole-breast tangent fields. This was delivered using a 3-D conformal technique. The pt received a boost delivering an additional 10 Gy in 5 fractions using a electron boost with electrons. The total dose was 50.05 Gy.   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  The Common Hereditary Gene Panel offered by [facility] includes sequencing and/or deletion duplication testing of the following 48 genes: APC, ATM, AXIN2, BARD1, BMPR1A, BRCA1, BRCA2, BRIP1, CDH1, CDK4, CDKN2A (p14ARF), CDKN2A (p16INK4a), CHEK2, CTNNA1, DICER1, EPCAM (Deletion/duplication testing only), GREM1 (promoter region deletion/duplication testing only), KIT, MEN1, MLH1, MSH2, MSH3, MSH6, MUTYH, NBN, NF1, NHTL1, PALB2, PDGFRA, PMS2, POLD1, POLE, PTEN, RAD50, RAD51C, RAD51D, RNF43, SDHB, SDHC, SDHD, SMAD4, SMARCA4. STK11, TP53, TSC1, TSC2, and VHL.  The following genes were evaluated for sequence changes only: SDHA and HOXB13 c.251G>A variant only. The report date is February 29, 2020.   03/2020 - 03/2025 Anti-estrogen oral therapy   Anastrozole . Pt took Anastrozole  from 11/2014-06/25/2018 and tolerated it well.     CHIEF COMPLIANT: Follow-up on anastrozole  therapy  HISTORY OF PRESENT ILLNESS:   History of Present Illness Breanna Neal is a 78 year old female with breast cancer who presents for a follow-up regarding anastrozole  therapy.  She has been on anastrozole  for almost five years and experiences hot flashes as a side effect. She is considering whether to continue the medication beyond the five-year mark, having previously taken it for seven years following a mastectomy in 2012. She is concerned about the possibility of another mastectomy  if she discontinues the medication.  Her bone density has shown improvement with current bone medication. She also takes  turmeric and magnesium, the latter at a dose of 500 mg for muscle cramps.  Her mammograms were last performed in March.     ALLERGIES:  is allergic to ibandronate and penicillins.  MEDICATIONS:  Current Outpatient Medications  Medication Sig Dispense Refill   alendronate (FOSAMAX) 70 MG tablet Take 70 mg by mouth every 30 (thirty) days. Take with a full glass of water on an empty stomach.     anastrozole  (ARIMIDEX ) 1 MG tablet TAKE 1 TABLET EVERY DAY (NEED MD APPOINTMENT FOR ANY FURTHER REFILLS) 90 tablet 3   aspirin 81 MG chewable tablet Chew 81 mg by mouth 2 (two) times a week.      atorvastatin (LIPITOR) 80 MG tablet Take 40 mg by mouth daily.      benzonatate  (TESSALON ) 100 MG capsule Take 1 capsule (100 mg total) by mouth every 8 (eight) hours. 21 capsule 0   Calcium Carbonate-Vitamin D (CALCIUM 600 + D PO) Take by mouth daily.      Cholecalciferol (VITAMIN D) 1000 UNITS capsule Take 5,000 Units by mouth. Every other week     ezetimibe (ZETIA) 10 MG tablet TAKE 1 TABLET EVERY DAY for 90     fexofenadine (ALLEGRA) 30 MG tablet Take 30 mg by mouth as needed.      Multiple Vitamin (MULTIVITAMIN) capsule Take 1 capsule by mouth daily.     Omega-3 Fatty Acids (OMEGA-3 FISH OIL) 1200 MG CAPS Take 2 capsules by mouth daily.      ondansetron  (ZOFRAN -ODT) 4 MG disintegrating tablet Take 1 tablet (4 mg total) by mouth every 8 (eight) hours as needed for nausea or vomiting. 20 tablet 0   TRIMETHOPRIM PO Take 100 mg by mouth as needed.      valsartan  (DIOVAN ) 160 MG tablet Take 1 tablet (160 mg total) by mouth daily. (Patient taking differently: Take 320 mg by mouth daily.)     No current facility-administered medications for this visit.    PHYSICAL EXAMINATION: ECOG PERFORMANCE STATUS: 1 - Symptomatic but completely ambulatory  Vitals:   09/22/24 1427  BP: (!) 145/65  Pulse: 78  Resp: 16  Temp: 98 F (36.7 C)  SpO2: 98%   Filed Weights   09/22/24 1427  Weight: 149 lb 3.2 oz (67.7  kg)    Physical Exam Breasts: No palpable lumps nodules bilateral breasts or axilla  (exam performed in the presence of a chaperone)  LABORATORY DATA:  I have reviewed the data as listed    Latest Ref Rng & Units 02/14/2024    8:25 PM 09/04/2014    1:36 PM 12/01/2013   10:39 AM  CMP  Glucose 70 - 99 mg/dL 828  874  885   BUN 8 - 23 mg/dL 12  84.4  88.2   Creatinine 0.44 - 1.00 mg/dL 9.27  0.8  0.8   Sodium 135 - 145 mmol/L 134  141  141   Potassium 3.5 - 5.1 mmol/L 3.9  4.6  4.0   Chloride 98 - 111 mmol/L 99     CO2 22 - 32 mmol/L 23  27  24    Calcium 8.9 - 10.3 mg/dL 9.0  89.9  89.7   Total Protein 6.5 - 8.1 g/dL 7.3  7.6  7.4   Total Bilirubin 0.0 - 1.2 mg/dL 1.5  8.33  8.40   Alkaline Phos 38 - 126 U/L 63  80  79   AST 15 - 41 U/L 35  21  29   ALT 0 - 44 U/L 40  26  43     Lab Results  Component Value Date   WBC 8.5 02/14/2024   HGB 14.5 02/14/2024   HCT 42.5 02/14/2024   MCV 88.4 02/14/2024   PLT 181 02/14/2024   NEUTROABS 6.6 02/14/2024    ASSESSMENT & PLAN:  Breast cancer of lower-inner quadrant of right female breast (HCC) Stage I invasive ductal carcinoma with DCIS right breast: Initial biopsy showed invasive ductal carcinoma of the final mastectomy only showed DCIS ER/PR positive HER-2 negative: Switched from Aromasin  to anastrozole  December 2015-2019 Left Breast cancer 2021   Breast cancer of lower-inner quadrant of right female breast (HCC) Mammogram done at [facility]: 12/27/2019: 1 cm lobulated mass with indistinct margin in the left breast lower outer quadrant Ultrasound-guided biopsy: Grade 2 IDC ER 100%, PR 5%, Ki-67 5%, HER-2 negative ratio 1.19   T1BN0 stage Ia 01/30/20: Left lumpectomy [facility]): IDC, grade 2, 1.3cm, with intermediate grade DCIS, clear margins, 3 left axillary lymph nodes negative Adjuvant radiation 02/28/2020-03/23/2020    Treatment plan: Adjuvant antiestrogen therapy with anastrozole  1 mg daily Anastrozole  toxicities: Denies any adverse  effects of anastrozole  therapy We discussed the topic of duration of antiestrogen therapy.  Given the previous history of breast cancer that originated 2 years after stopping antiestrogen therapy, she wants to continue this longer.  Therefore we will continue this for at least 2 more years and then make a decision.   Breast cancer surveillance: 1.  Breast exam 09/22/2024: Benign 2. mammogram 03/07/2024: [facility] benign breast density category B   Return to clinic in 1 year for follow-up      No orders of the defined types were placed in this encounter.  The patient has a good understanding of the overall plan. she agrees with it. she will call with any problems that may develop before the next visit here. Total time spent: 30 mins including face to face time and time spent for planning, charting and co-ordination of care   Viinay K Melchor Kirchgessner, MD 09/22/24

## 2024-09-24 DIAGNOSIS — Z23 Encounter for immunization: Secondary | ICD-10-CM | POA: Diagnosis not present

## 2024-10-20 DIAGNOSIS — Z23 Encounter for immunization: Secondary | ICD-10-CM | POA: Diagnosis not present

## 2024-10-24 DIAGNOSIS — R011 Cardiac murmur, unspecified: Secondary | ICD-10-CM | POA: Diagnosis not present

## 2024-10-24 DIAGNOSIS — E1169 Type 2 diabetes mellitus with other specified complication: Secondary | ICD-10-CM | POA: Diagnosis not present

## 2024-10-24 DIAGNOSIS — C50512 Malignant neoplasm of lower-outer quadrant of left female breast: Secondary | ICD-10-CM | POA: Diagnosis not present

## 2024-10-24 DIAGNOSIS — I2510 Atherosclerotic heart disease of native coronary artery without angina pectoris: Secondary | ICD-10-CM | POA: Diagnosis not present

## 2024-10-24 DIAGNOSIS — K582 Mixed irritable bowel syndrome: Secondary | ICD-10-CM | POA: Diagnosis not present

## 2024-10-24 DIAGNOSIS — R809 Proteinuria, unspecified: Secondary | ICD-10-CM | POA: Diagnosis not present

## 2024-10-24 DIAGNOSIS — M8580 Other specified disorders of bone density and structure, unspecified site: Secondary | ICD-10-CM | POA: Diagnosis not present

## 2024-10-24 DIAGNOSIS — I119 Hypertensive heart disease without heart failure: Secondary | ICD-10-CM | POA: Diagnosis not present

## 2024-10-24 DIAGNOSIS — E7849 Other hyperlipidemia: Secondary | ICD-10-CM | POA: Diagnosis not present

## 2024-10-24 DIAGNOSIS — E785 Hyperlipidemia, unspecified: Secondary | ICD-10-CM | POA: Diagnosis not present

## 2024-10-24 DIAGNOSIS — R413 Other amnesia: Secondary | ICD-10-CM | POA: Diagnosis not present

## 2024-11-03 ENCOUNTER — Other Ambulatory Visit: Payer: Self-pay | Admitting: Hematology and Oncology

## 2024-11-23 NOTE — Progress Notes (Signed)
 Update: MUTYH c.925C>T VUS reclassified to likely benign. Report date is 10/31/2024.

## 2025-09-25 ENCOUNTER — Ambulatory Visit: Admitting: Hematology and Oncology
